# Patient Record
Sex: Female | Born: 1990
Health system: Southern US, Community
[De-identification: ages and names within clinical notes are randomized; demographics above are authoritative.]

## PROBLEM LIST (undated history)

## (undated) DIAGNOSIS — L732 Hidradenitis suppurativa: Secondary | ICD-10-CM

## (undated) DIAGNOSIS — I10 Essential (primary) hypertension: Secondary | ICD-10-CM

## (undated) DIAGNOSIS — G43909 Migraine, unspecified, not intractable, without status migrainosus: Secondary | ICD-10-CM

## (undated) HISTORY — DX: Migraine, unspecified, not intractable, without status migrainosus: G43.909

## (undated) HISTORY — PX: OTHER SURGICAL HISTORY: SHX169

## (undated) HISTORY — DX: Hidradenitis suppurativa: L73.2

---
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
## 2010-11-17 ENCOUNTER — Emergency Department (HOSPITAL_COMMUNITY): Payer: Self-pay

## 2010-11-17 ENCOUNTER — Emergency Department (HOSPITAL_COMMUNITY)
Admission: EM | Admit: 2010-11-17 | Discharge: 2010-11-17 | Disposition: A | Discharge status: Home / Self Care | Payer: Self-pay | Attending: Emergency Medicine | Admitting: Emergency Medicine

## 2010-11-17 DIAGNOSIS — Y92009 Unspecified place in unspecified non-institutional (private) residence as the place of occurrence of the external cause: Secondary | ICD-10-CM | POA: Insufficient documentation

## 2010-11-17 DIAGNOSIS — Z79899 Other long term (current) drug therapy: Secondary | ICD-10-CM | POA: Insufficient documentation

## 2010-11-17 DIAGNOSIS — W07XXXA Fall from chair, initial encounter: Secondary | ICD-10-CM | POA: Insufficient documentation

## 2010-11-17 DIAGNOSIS — K219 Gastro-esophageal reflux disease without esophagitis: Secondary | ICD-10-CM | POA: Insufficient documentation

## 2010-11-17 DIAGNOSIS — R071 Chest pain on breathing: Secondary | ICD-10-CM | POA: Insufficient documentation

## 2012-04-04 IMAGING — CR DG RIBS W/ CHEST 3+V*R*
4 series · 4 of 4 positions shown · non-contrast
Comparison: None.

CLINICAL DATA: Pain post fall

RIGHT RIBS AND CHEST - 3+ VIEW

[view not recorded (1 of 4)]
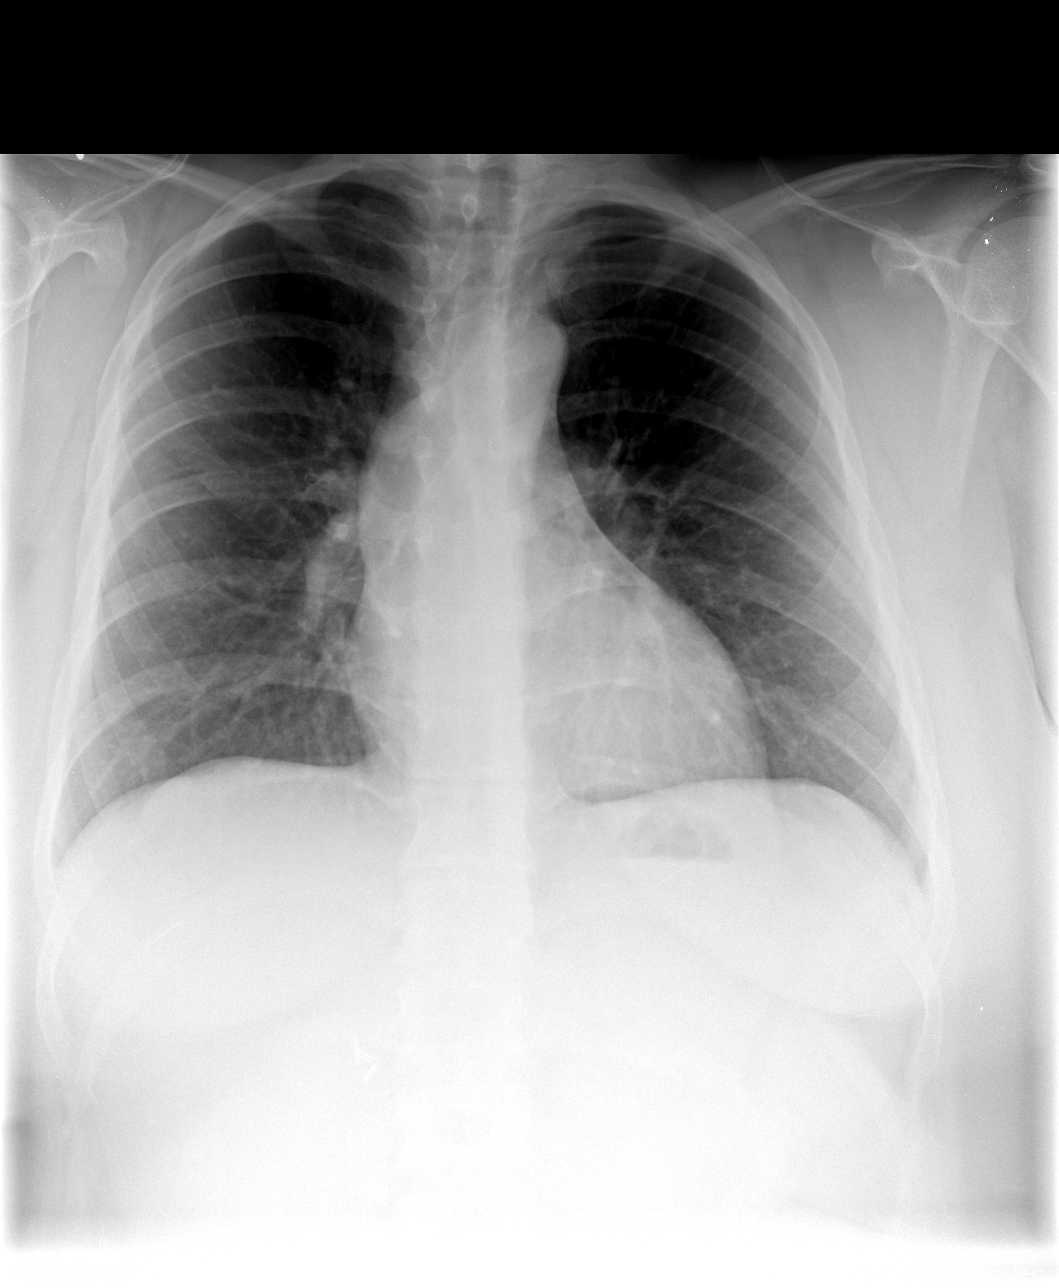

[view not recorded (2 of 4)]
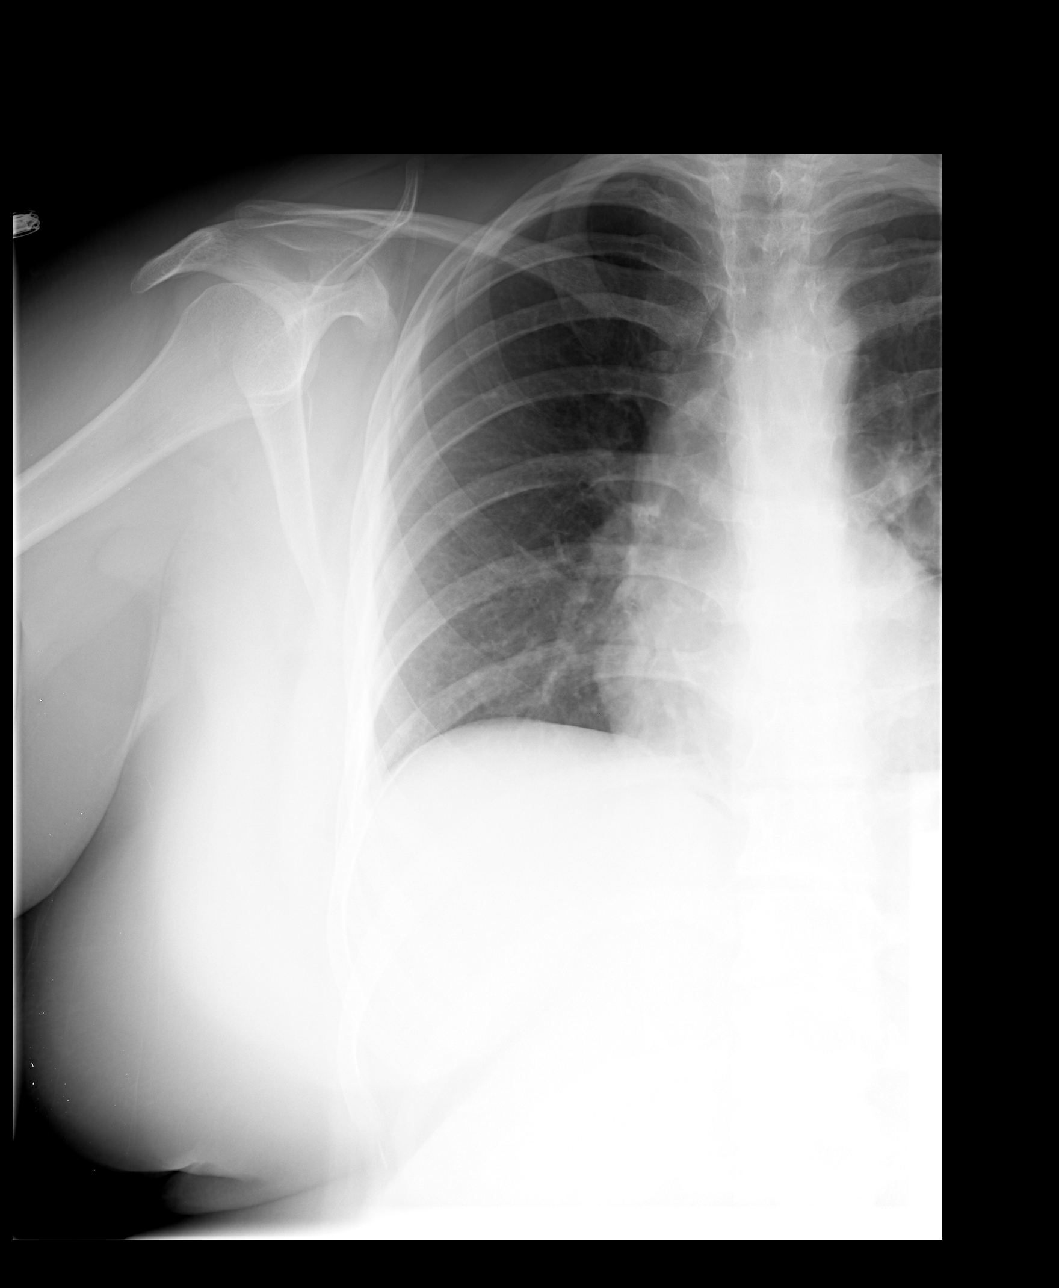

[view not recorded (3 of 4)]
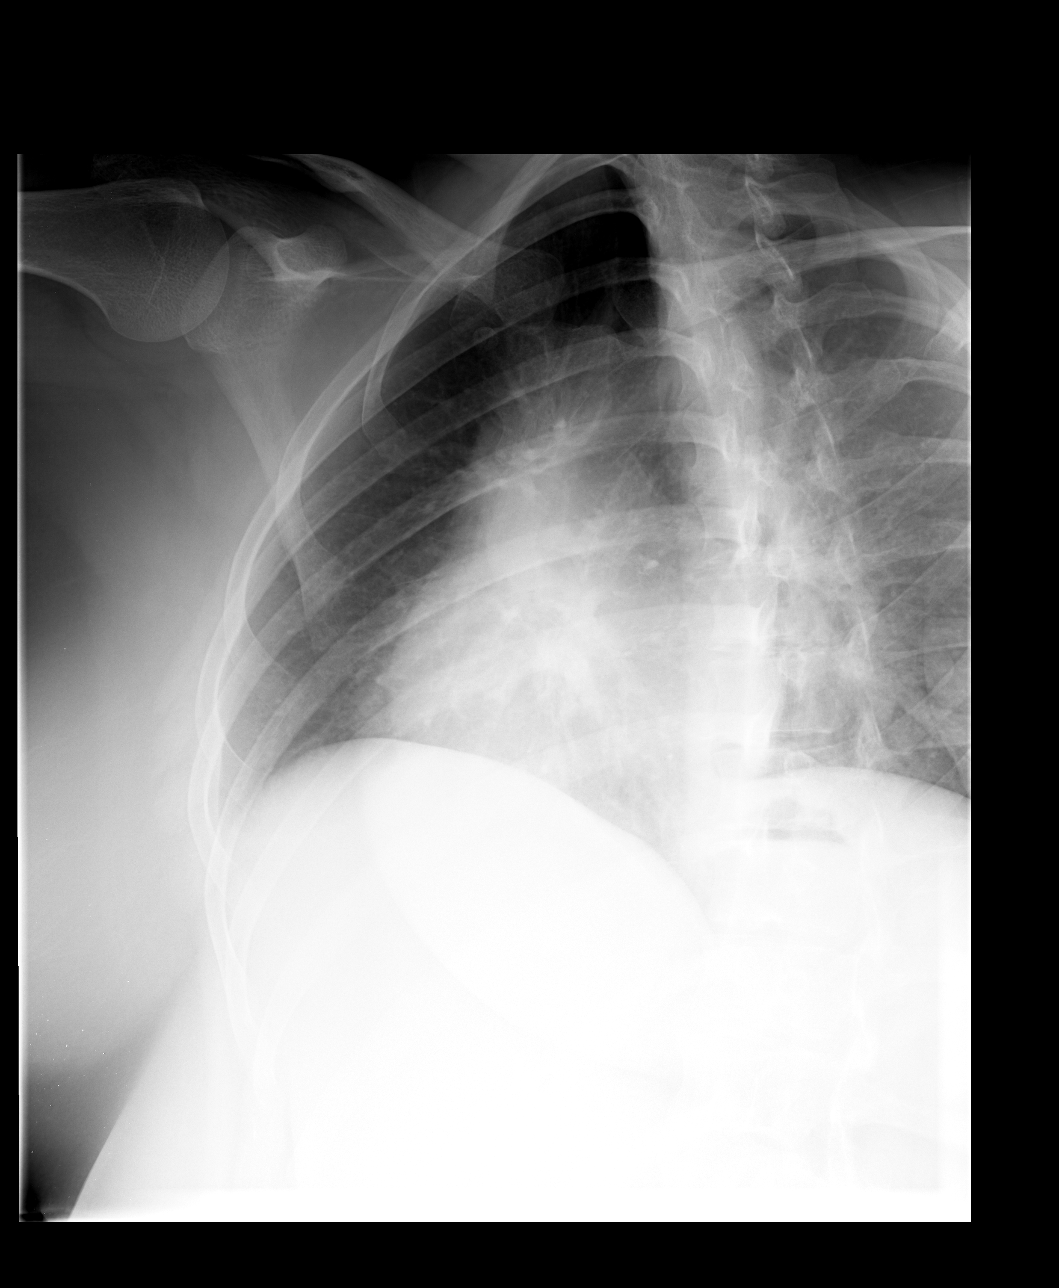

[view not recorded (4 of 4)]
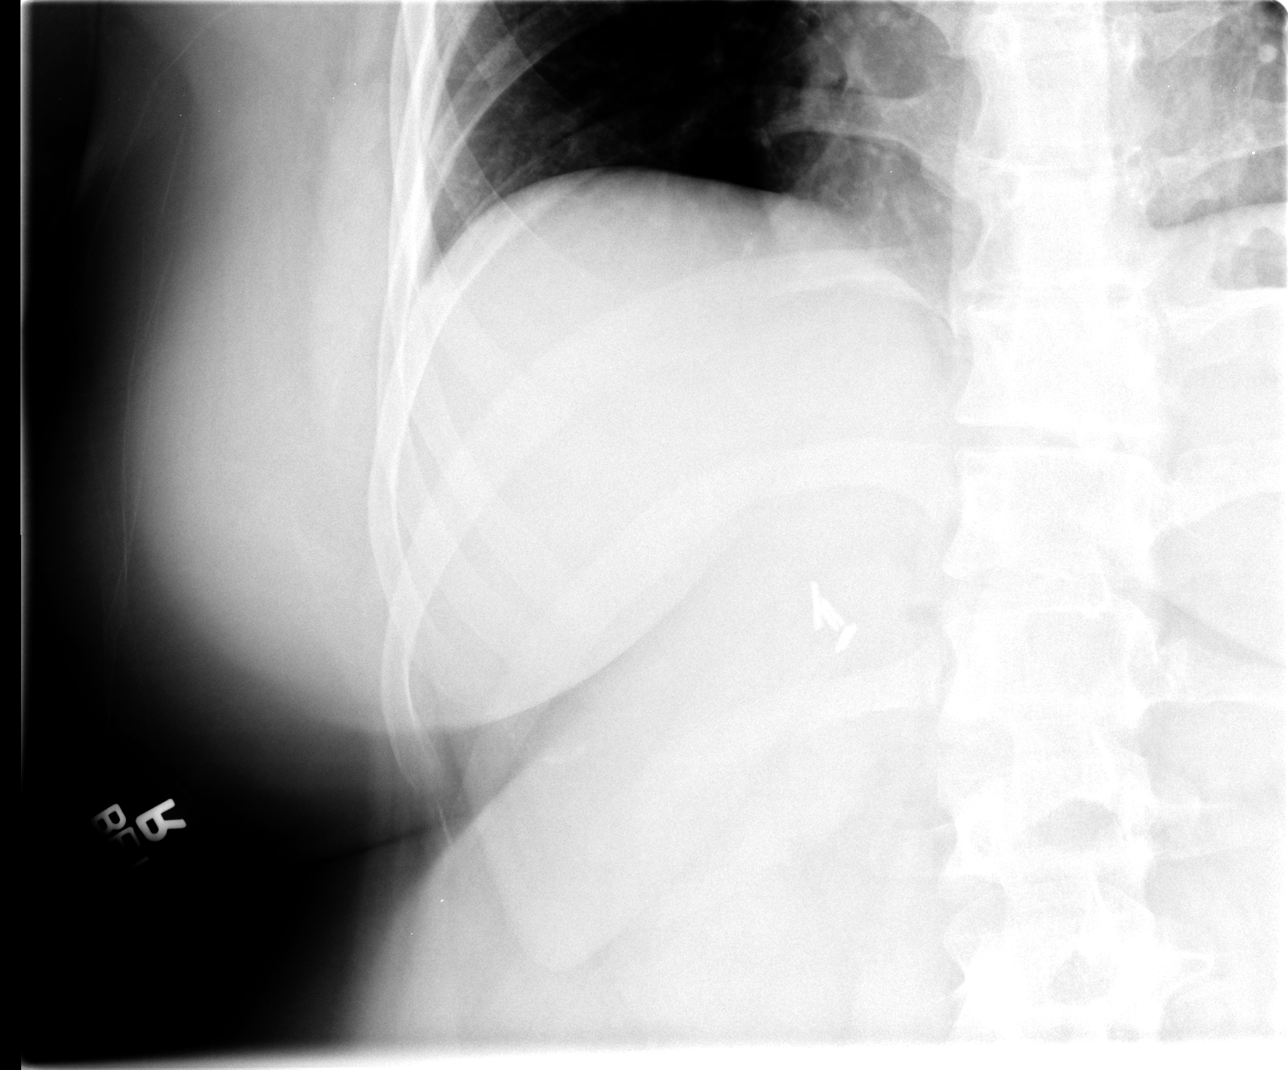

[4 of 4 positions shown; findings below may reference images not displayed]

FINDINGS: Lungs are clear.  No pneumothorax or effusion.  Heart
size normal.  Vascular clips in the right upper abdomen.  Detailed
views reveal no displaced fracture or other focal lesion.
IMPRESSION: Negative

## 2013-04-22 DIAGNOSIS — L732 Hidradenitis suppurativa: Secondary | ICD-10-CM | POA: Insufficient documentation

## 2019-10-12 DIAGNOSIS — Z7182 Exercise counseling: Secondary | ICD-10-CM | POA: Diagnosis not present

## 2019-10-12 DIAGNOSIS — E669 Obesity, unspecified: Secondary | ICD-10-CM | POA: Diagnosis not present

## 2019-10-12 DIAGNOSIS — Z713 Dietary counseling and surveillance: Secondary | ICD-10-CM | POA: Diagnosis not present

## 2019-10-12 DIAGNOSIS — Z6841 Body Mass Index (BMI) 40.0 and over, adult: Secondary | ICD-10-CM | POA: Diagnosis not present

## 2019-10-28 DIAGNOSIS — M7711 Lateral epicondylitis, right elbow: Secondary | ICD-10-CM | POA: Diagnosis not present

## 2019-11-09 DIAGNOSIS — Z20822 Contact with and (suspected) exposure to covid-19: Secondary | ICD-10-CM | POA: Diagnosis not present

## 2019-11-18 DIAGNOSIS — L732 Hidradenitis suppurativa: Secondary | ICD-10-CM | POA: Diagnosis not present

## 2019-11-18 DIAGNOSIS — Z79899 Other long term (current) drug therapy: Secondary | ICD-10-CM | POA: Diagnosis not present

## 2019-11-21 DIAGNOSIS — Z6841 Body Mass Index (BMI) 40.0 and over, adult: Secondary | ICD-10-CM | POA: Diagnosis not present

## 2019-11-21 DIAGNOSIS — E669 Obesity, unspecified: Secondary | ICD-10-CM | POA: Diagnosis not present

## 2019-11-21 DIAGNOSIS — Z79899 Other long term (current) drug therapy: Secondary | ICD-10-CM | POA: Diagnosis not present

## 2019-11-21 DIAGNOSIS — Z7182 Exercise counseling: Secondary | ICD-10-CM | POA: Diagnosis not present

## 2019-11-21 DIAGNOSIS — Z5181 Encounter for therapeutic drug level monitoring: Secondary | ICD-10-CM | POA: Diagnosis not present

## 2019-11-21 DIAGNOSIS — Z713 Dietary counseling and surveillance: Secondary | ICD-10-CM | POA: Diagnosis not present

## 2019-12-02 DIAGNOSIS — L732 Hidradenitis suppurativa: Secondary | ICD-10-CM | POA: Diagnosis not present

## 2019-12-02 DIAGNOSIS — Z79899 Other long term (current) drug therapy: Secondary | ICD-10-CM | POA: Diagnosis not present

## 2019-12-09 DIAGNOSIS — M7711 Lateral epicondylitis, right elbow: Secondary | ICD-10-CM | POA: Diagnosis not present

## 2019-12-29 DIAGNOSIS — Z7182 Exercise counseling: Secondary | ICD-10-CM | POA: Diagnosis not present

## 2019-12-29 DIAGNOSIS — Z713 Dietary counseling and surveillance: Secondary | ICD-10-CM | POA: Diagnosis not present

## 2019-12-29 DIAGNOSIS — Z6841 Body Mass Index (BMI) 40.0 and over, adult: Secondary | ICD-10-CM | POA: Diagnosis not present

## 2019-12-29 DIAGNOSIS — E669 Obesity, unspecified: Secondary | ICD-10-CM | POA: Diagnosis not present

## 2019-12-30 DIAGNOSIS — Z79899 Other long term (current) drug therapy: Secondary | ICD-10-CM | POA: Diagnosis not present

## 2019-12-30 DIAGNOSIS — L732 Hidradenitis suppurativa: Secondary | ICD-10-CM | POA: Diagnosis not present

## 2020-01-25 DIAGNOSIS — Z713 Dietary counseling and surveillance: Secondary | ICD-10-CM | POA: Diagnosis not present

## 2020-01-25 DIAGNOSIS — Z6841 Body Mass Index (BMI) 40.0 and over, adult: Secondary | ICD-10-CM | POA: Diagnosis not present

## 2020-01-25 DIAGNOSIS — Z7182 Exercise counseling: Secondary | ICD-10-CM | POA: Diagnosis not present

## 2020-02-10 DIAGNOSIS — L732 Hidradenitis suppurativa: Secondary | ICD-10-CM | POA: Diagnosis not present

## 2020-02-10 DIAGNOSIS — Z79899 Other long term (current) drug therapy: Secondary | ICD-10-CM | POA: Diagnosis not present

## 2020-02-15 DIAGNOSIS — Z7182 Exercise counseling: Secondary | ICD-10-CM | POA: Diagnosis not present

## 2020-02-15 DIAGNOSIS — Z6841 Body Mass Index (BMI) 40.0 and over, adult: Secondary | ICD-10-CM | POA: Diagnosis not present

## 2020-02-15 DIAGNOSIS — Z713 Dietary counseling and surveillance: Secondary | ICD-10-CM | POA: Diagnosis not present

## 2020-02-15 DIAGNOSIS — E669 Obesity, unspecified: Secondary | ICD-10-CM | POA: Diagnosis not present

## 2020-03-19 DIAGNOSIS — Z713 Dietary counseling and surveillance: Secondary | ICD-10-CM | POA: Diagnosis not present

## 2020-03-19 DIAGNOSIS — Z6841 Body Mass Index (BMI) 40.0 and over, adult: Secondary | ICD-10-CM | POA: Diagnosis not present

## 2020-03-19 DIAGNOSIS — Z7182 Exercise counseling: Secondary | ICD-10-CM | POA: Diagnosis not present

## 2020-03-19 DIAGNOSIS — E669 Obesity, unspecified: Secondary | ICD-10-CM | POA: Diagnosis not present

## 2020-03-19 DIAGNOSIS — Z20822 Contact with and (suspected) exposure to covid-19: Secondary | ICD-10-CM | POA: Diagnosis not present

## 2020-04-06 DIAGNOSIS — M7711 Lateral epicondylitis, right elbow: Secondary | ICD-10-CM | POA: Diagnosis not present

## 2020-04-13 DIAGNOSIS — Z79899 Other long term (current) drug therapy: Secondary | ICD-10-CM | POA: Diagnosis not present

## 2020-04-13 DIAGNOSIS — L732 Hidradenitis suppurativa: Secondary | ICD-10-CM | POA: Diagnosis not present

## 2020-04-24 DIAGNOSIS — Z713 Dietary counseling and surveillance: Secondary | ICD-10-CM | POA: Diagnosis not present

## 2020-04-24 DIAGNOSIS — Z7182 Exercise counseling: Secondary | ICD-10-CM | POA: Diagnosis not present

## 2020-04-24 DIAGNOSIS — Z6841 Body Mass Index (BMI) 40.0 and over, adult: Secondary | ICD-10-CM | POA: Diagnosis not present

## 2020-05-17 ENCOUNTER — Ambulatory Visit (INDEPENDENT_AMBULATORY_CARE_PROVIDER_SITE_OTHER): Payer: BC Managed Care – PPO | Admitting: Obstetrics & Gynecology

## 2020-05-17 ENCOUNTER — Other Ambulatory Visit: Payer: Self-pay

## 2020-05-17 ENCOUNTER — Encounter: Payer: Self-pay | Admitting: Family Medicine

## 2020-05-17 ENCOUNTER — Encounter: Payer: Self-pay | Admitting: Obstetrics & Gynecology

## 2020-05-17 ENCOUNTER — Ambulatory Visit: Payer: Self-pay

## 2020-05-17 VITALS — BP 149/95 | HR 93

## 2020-05-17 DIAGNOSIS — Z331 Pregnant state, incidental: Secondary | ICD-10-CM | POA: Diagnosis not present

## 2020-05-17 DIAGNOSIS — Z3201 Encounter for pregnancy test, result positive: Secondary | ICD-10-CM

## 2020-05-17 LAB — POCT PREGNANCY, URINE: Preg Test, Ur: POSITIVE — AB

## 2020-05-17 MED ORDER — PRENATAL GUMMIES 0.18-25 MG PO CHEW: 1.0000 | CHEWABLE_TABLET | Freq: Every day | ORAL | 2 refills | Status: DC

## 2020-05-17 NOTE — Progress Notes (Signed)
  History:  [name] is a 29 y.o. G1P0000 who presents to clinic today for pregnancy test. Denies bleeding, pelvic pain or other concerns. LMP 04/20/2019, making her [redacted]w[redacted]d.   Past Medical History:  Diagnosis Date  . Hidradenitis suppurativa   . Migraines     Past Surgical History:  Procedure Laterality Date  . CHOLECYSTECTOMY  2009   The following portions of the patient's history were reviewed and updated as appropriate: allergies, current medications, past family history, past medical history, past social history, past surgical history and problem list.   Review of Systems:  Pertinent items noted in HPI and remainder of comprehensive ROS otherwise negative.  Objective:  Physical Exam BP (!) 149/95   Pulse 93   LMP 04/19/2020 (Exact Date)  Physical Exam GENERAL: Well-developed, well-nourished female in no acute distress  SKIN: Warm, dry and without erythema PSYCH: Normal mood and affect HEENT: Normocephalic, atraumatic.   LUNGS: Normal respiratory effort, normal breath sounds HEART: Regular rate noted ABDOMEN: Soft, no masses noted PELVIC: Deferred EXTREMITIES: No edema, no cyanosis, normal range of movement  Labs and Imaging Results for orders placed or performed in visit on 05/17/20 (from the past 24 hour(s))  Pregnancy, urine POC     Status: Abnormal   Collection Time: 05/17/20  1:58 PM  Result Value Ref Range   Preg Test, Ur POSITIVE (A) NEGATIVE    Assessment & Plan:  1. Positive pregnancy test Reviewed her medication list, told her to discontinue Phentermine, Topamax and Lodine. She will follow up with her other providers to come up with alternatives. No other concerns - Prenatal MV & Min w/FA-DHA (PRENATAL GUMMIES) 0.18-25 MG CHEW; Chew 1 tablet by mouth daily.  Dispense: 30 tablet; Refill: 2  Return in about 4 weeks (around 06/14/2020) for New OB visit with any [facility] office (Except [facility]). Advised to call [facility] given proximity to [place]   I spent  15 minutes dedicated to the care of this patient including pre-visit review of records, face to face time with the patient discussing her conditions and treatments and post visit ordering of testing.   Tereso Newcomer, MD 05/17/2020 2:36 PM

## 2020-05-17 NOTE — Patient Instructions (Signed)
First Trimester of Pregnancy The first trimester of pregnancy is from week 1 until the end of week 13 (months 1 through 3). A week after a sperm fertilizes an egg, the egg will implant on the wall of the uterus. This embryo will begin to develop into a baby. Genes from you and your partner will form the baby. The female genes will determine whether the baby will be a boy or a girl. At 6-8 weeks, the eyes and face will be formed, and the heartbeat can be seen on ultrasound. At the end of 12 weeks, all the baby's organs will be formed. Now that you are pregnant, you will want to do everything you can to have a healthy baby. Two of the most important things are to get good prenatal care and to follow your health care provider's instructions. Prenatal care is all the medical care you receive before the baby's birth. This care will help prevent, find, and treat any problems during the pregnancy and childbirth. Body changes during your first trimester Your body goes through many changes during pregnancy. The changes vary from woman to woman.  You may gain or lose a couple of pounds at first.  You may feel sick to your stomach (nauseous) and you may throw up (vomit). If the vomiting is uncontrollable, call your health care provider.  You may tire easily.  You may develop headaches that can be relieved by medicines. All medicines should be approved by your health care provider.  You may urinate more often. Painful urination may mean you have a bladder infection.  You may develop heartburn as a result of your pregnancy.  You may develop constipation because certain hormones are causing the muscles that push stool through your intestines to slow down.  You may develop hemorrhoids or swollen veins (varicose veins).  Your breasts may begin to grow larger and become tender. Your nipples may stick out more, and the tissue that surrounds them (areola) may become darker.  Your gums may bleed and may be  sensitive to brushing and flossing.  Dark spots or blotches (chloasma, mask of pregnancy) may develop on your face. This will likely fade after the baby is born.  Your menstrual periods will stop.  You may have a loss of appetite.  You may develop cravings for certain kinds of food.  You may have changes in your emotions from day to day, such as being excited to be pregnant or being concerned that something may go wrong with the pregnancy and baby.  You may have more vivid and strange dreams.  You may have changes in your hair. These can include thickening of your hair, rapid growth, and changes in texture. Some women also have hair loss during or after pregnancy, or hair that feels dry or thin. Your hair will most likely return to normal after your baby is born. What to expect at prenatal visits During a routine prenatal visit:  You will be weighed to make sure you and the baby are growing normally.  Your blood pressure will be taken.  Your abdomen will be measured to track your baby's growth.  The fetal heartbeat will be listened to between weeks 10 and 14 of your pregnancy.  Test results from any previous visits will be discussed. Your health care provider may ask you:  How you are feeling.  If you are feeling the baby move.  If you have had any abnormal symptoms, such as leaking fluid, bleeding, severe headaches, or abdominal   cramping.  If you are using any tobacco products, including cigarettes, chewing tobacco, and electronic cigarettes.  If you have any questions. Other tests that may be performed during your first trimester include:  Blood tests to find your blood type and to check for the presence of any previous infections. The tests will also be used to check for low iron levels (anemia) and protein on red blood cells (Rh antibodies). Depending on your risk factors, or if you previously had diabetes during pregnancy, you may have tests to check for high blood sugar  that affects pregnant women (gestational diabetes).  Urine tests to check for infections, diabetes, or protein in the urine.  An ultrasound to confirm the proper growth and development of the baby.  Fetal screens for spinal cord problems (spina bifida) and Down syndrome.  HIV (human immunodeficiency virus) testing. Routine prenatal testing includes screening for HIV, unless you choose not to have this test.  You may need other tests to make sure you and the baby are doing well. Follow these instructions at home: Medicines  Follow your health care provider's instructions regarding medicine use. Specific medicines may be either safe or unsafe to take during pregnancy.  Take a prenatal vitamin that contains at least 600 micrograms (mcg) of folic acid.  If you develop constipation, try taking a stool softener if your health care provider approves. Eating and drinking   Eat a balanced diet that includes fresh fruits and vegetables, whole grains, good sources of protein such as meat, eggs, or tofu, and low-fat dairy. Your health care provider will help you determine the amount of weight gain that is right for you.  Avoid raw meat and uncooked cheese. These carry germs that can cause birth defects in the baby.  Eating four or five small meals rather than three large meals a day may help relieve nausea and vomiting. If you start to feel nauseous, eating a few soda crackers can be helpful. Drinking liquids between meals, instead of during meals, also seems to help ease nausea and vomiting.  Limit foods that are high in fat and processed sugars, such as fried and sweet foods.  To prevent constipation: ? Eat foods that are high in fiber, such as fresh fruits and vegetables, whole grains, and beans. ? Drink enough fluid to keep your urine clear or pale yellow. Activity  Exercise only as directed by your health care provider. Most women can continue their usual exercise routine during  pregnancy. Try to exercise for 30 minutes at least 5 days a week. Exercising will help you: ? Control your weight. ? Stay in shape. ? Be prepared for labor and delivery.  Experiencing pain or cramping in the lower abdomen or lower back is a good sign that you should stop exercising. Check with your health care provider before continuing with normal exercises.  Try to avoid standing for long periods of time. Move your legs often if you must stand in one place for a long time.  Avoid heavy lifting.  Wear low-heeled shoes and practice good posture.  You may continue to have sex unless your health care provider tells you not to. Relieving pain and discomfort  Wear a good support bra to relieve breast tenderness.  Take warm sitz baths to soothe any pain or discomfort caused by hemorrhoids. Use hemorrhoid cream if your health care provider approves.  Rest with your legs elevated if you have leg cramps or low back pain.  If you develop varicose veins in   your legs, wear support hose. Elevate your feet for 15 minutes, 3-4 times a day. Limit salt in your diet. Prenatal care  Schedule your prenatal visits by the twelfth week of pregnancy. They are usually scheduled monthly at first, then more often in the last 2 months before delivery.  Write down your questions. Take them to your prenatal visits.  Keep all your prenatal visits as told by your health care provider. This is important. Safety  Wear your seat belt at all times when driving.  Make a list of emergency phone numbers, including numbers for family, friends, the hospital, and police and fire departments. General instructions  Ask your health care provider for a referral to a local prenatal education class. Begin classes no later than the beginning of month 6 of your pregnancy.  Ask for help if you have counseling or nutritional needs during pregnancy. Your health care provider can offer advice or refer you to specialists for help  with various needs.  Do not use hot tubs, steam rooms, or saunas.  Do not douche or use tampons or scented sanitary pads.  Do not cross your legs for long periods of time.  Avoid cat litter boxes and soil used by cats. These carry germs that can cause birth defects in the baby and possibly loss of the fetus by miscarriage or stillbirth.  Avoid all smoking, herbs, alcohol, and medicines not prescribed by your health care provider. Chemicals in these products affect the formation and growth of the baby.  Do not use any products that contain nicotine or tobacco, such as cigarettes and e-cigarettes. If you need help quitting, ask your health care provider. You may receive counseling support and other resources to help you quit.  Schedule a dentist appointment. At home, brush your teeth with a soft toothbrush and be gentle when you floss. Contact a health care provider if:  You have dizziness.  You have mild pelvic cramps, pelvic pressure, or nagging pain in the abdominal area.  You have persistent nausea, vomiting, or diarrhea.  You have a bad smelling vaginal discharge.  You have pain when you urinate.  You notice increased swelling in your face, hands, legs, or ankles.  You are exposed to fifth disease or chickenpox.  You are exposed to German measles (rubella) and have never had it. Get help right away if:  You have a fever.  You are leaking fluid from your vagina.  You have spotting or bleeding from your vagina.  You have severe abdominal cramping or pain.  You have rapid weight gain or loss.  You vomit blood or material that looks like coffee grounds.  You develop a severe headache.  You have shortness of breath.  You have any kind of trauma, such as from a fall or a car accident. Summary  The first trimester of pregnancy is from week 1 until the end of week 13 (months 1 through 3).  Your body goes through many changes during pregnancy. The changes vary from  woman to woman.  You will have routine prenatal visits. During those visits, your health care provider will examine you, discuss any test results you may have, and talk with you about how you are feeling. This information is not intended to replace advice given to you by your health care provider. Make sure you discuss any questions you have with your health care provider. Document Revised: 06/05/2017 Document Reviewed: 06/04/2016 Elsevier Patient Education  2020 [employer]  

## 2020-05-23 DIAGNOSIS — Z3201 Encounter for pregnancy test, result positive: Secondary | ICD-10-CM | POA: Diagnosis not present

## 2020-05-25 DIAGNOSIS — Z79899 Other long term (current) drug therapy: Secondary | ICD-10-CM | POA: Diagnosis not present

## 2020-05-25 DIAGNOSIS — L732 Hidradenitis suppurativa: Secondary | ICD-10-CM | POA: Diagnosis not present

## 2020-05-29 DIAGNOSIS — Z3201 Encounter for pregnancy test, result positive: Secondary | ICD-10-CM | POA: Diagnosis not present

## 2020-06-05 DIAGNOSIS — Z331 Pregnant state, incidental: Secondary | ICD-10-CM | POA: Diagnosis not present

## 2020-06-12 ENCOUNTER — Other Ambulatory Visit: Payer: BC Managed Care – PPO

## 2020-06-12 DIAGNOSIS — Z331 Pregnant state, incidental: Secondary | ICD-10-CM | POA: Diagnosis not present

## 2020-06-14 ENCOUNTER — Other Ambulatory Visit: Payer: Self-pay | Admitting: Obstetrics & Gynecology

## 2020-06-14 DIAGNOSIS — O3680X0 Pregnancy with inconclusive fetal viability, not applicable or unspecified: Secondary | ICD-10-CM

## 2020-06-15 ENCOUNTER — Other Ambulatory Visit: Payer: Self-pay

## 2020-06-15 ENCOUNTER — Ambulatory Visit (INDEPENDENT_AMBULATORY_CARE_PROVIDER_SITE_OTHER): Payer: BC Managed Care – PPO

## 2020-06-15 DIAGNOSIS — Z3A08 8 weeks gestation of pregnancy: Secondary | ICD-10-CM

## 2020-06-15 DIAGNOSIS — O3680X0 Pregnancy with inconclusive fetal viability, not applicable or unspecified: Secondary | ICD-10-CM | POA: Diagnosis not present

## 2020-06-15 NOTE — Progress Notes (Signed)
[place] 8+1 wks,single IUP with YS,positive fht 178 bpm,crl 17.61 mm,normal ovaries

## 2020-06-22 DIAGNOSIS — M5442 Lumbago with sciatica, left side: Secondary | ICD-10-CM | POA: Diagnosis not present

## 2020-06-22 DIAGNOSIS — M9903 Segmental and somatic dysfunction of lumbar region: Secondary | ICD-10-CM | POA: Diagnosis not present

## 2020-06-22 DIAGNOSIS — M9902 Segmental and somatic dysfunction of thoracic region: Secondary | ICD-10-CM | POA: Diagnosis not present

## 2020-06-22 DIAGNOSIS — M9905 Segmental and somatic dysfunction of pelvic region: Secondary | ICD-10-CM | POA: Diagnosis not present

## 2020-07-04 DIAGNOSIS — M7711 Lateral epicondylitis, right elbow: Secondary | ICD-10-CM | POA: Diagnosis not present

## 2020-07-07 NOTE — L&D Delivery Note (Addendum)
Vacuum Assisted Vaginal Delivery  Indication for operative vaginal delivery: NRFHT  Patient was examined and found to be fully dilated with fetal station of +2.  Patient's bladder was noted to be empty, and there were no known fetal contraindications to operative vaginal delivery. FHR tracing remarkable for recurrent deep late/variable decels with contractions. Dr. Alysia Penna called to bedside for assistance.  Risks of vacuum assistance were discussed in detail, including but not limited to, bleeding, infection, damage to maternal tissues, fetal cephalohematoma, inability to effect vaginal delivery of the head or shoulder dystocia that cannot be resolved by established maneuvers and need for emergency cesarean section.  Patient gave verbal consent.  The soft vacuum cup was positioned over the sagittal suture 3 cm anterior to posterior fontanelle.  Pressure was then increased to 500 mmHg, and the patient was instructed to push.  Pulling was administered along the pelvic curve while patient was pushing; there were 4 contractions and 0 popoffs.  Vacuum was reduced in between contractions.  The infant was then delivered atraumatically, noted to be a viable female infant, Apgars of 8 and 9.  No nuchal cord noted at delivery. Arterial blood gas sent. Neonatology present for delivery.  There was spontaneous placental delivery, intact with three-vessel cord.  3b degree perineal laceration noted requiring repair, given poor visualization and pain control will repair in OR, please refer to operative note for further details. [id number] mL, epidural anesthesia.   Sponge, instrument and needle counts were correct x 2.  The patient and baby were stable after delivery and remained in couplet care, with plans to transfer later to postpartum unit.  Alric Seton, MD OB Fellow, Faculty [facility], Center for [facility] 01/18/2021 10:38 PM  OB Attending I was present in the room for the entire  delivery. Agree with above. See OP note for additional information on vaginal repair.  Nettie Elm, MD

## 2020-07-13 ENCOUNTER — Other Ambulatory Visit: Payer: Self-pay | Admitting: Obstetrics & Gynecology

## 2020-07-13 DIAGNOSIS — Z79899 Other long term (current) drug therapy: Secondary | ICD-10-CM | POA: Diagnosis not present

## 2020-07-13 DIAGNOSIS — Z3682 Encounter for antenatal screening for nuchal translucency: Secondary | ICD-10-CM

## 2020-07-13 DIAGNOSIS — L732 Hidradenitis suppurativa: Secondary | ICD-10-CM | POA: Diagnosis not present

## 2020-07-13 DIAGNOSIS — Z5112 Encounter for antineoplastic immunotherapy: Secondary | ICD-10-CM | POA: Diagnosis not present

## 2020-07-16 ENCOUNTER — Other Ambulatory Visit: Payer: BC Managed Care – PPO

## 2020-07-16 ENCOUNTER — Encounter: Payer: BC Managed Care – PPO | Admitting: Women's Health

## 2020-07-16 ENCOUNTER — Ambulatory Visit: Payer: BC Managed Care – PPO | Admitting: *Deleted

## 2020-07-16 DIAGNOSIS — Z20822 Contact with and (suspected) exposure to covid-19: Secondary | ICD-10-CM | POA: Diagnosis not present

## 2020-07-16 DIAGNOSIS — Z20828 Contact with and (suspected) exposure to other viral communicable diseases: Secondary | ICD-10-CM | POA: Diagnosis not present

## 2020-07-19 ENCOUNTER — Encounter: Payer: Self-pay | Admitting: *Deleted

## 2020-07-19 DIAGNOSIS — U071 COVID-19: Secondary | ICD-10-CM | POA: Diagnosis not present

## 2020-07-19 DIAGNOSIS — R509 Fever, unspecified: Secondary | ICD-10-CM | POA: Diagnosis not present

## 2020-07-19 DIAGNOSIS — R059 Cough, unspecified: Secondary | ICD-10-CM | POA: Diagnosis not present

## 2020-07-19 DIAGNOSIS — Z20822 Contact with and (suspected) exposure to covid-19: Secondary | ICD-10-CM | POA: Diagnosis not present

## 2020-07-27 ENCOUNTER — Ambulatory Visit: Payer: BC Managed Care – PPO | Admitting: *Deleted

## 2020-07-27 ENCOUNTER — Encounter: Payer: BC Managed Care – PPO | Admitting: Women's Health

## 2020-08-02 ENCOUNTER — Encounter: Payer: Self-pay | Admitting: Medical

## 2020-08-02 DIAGNOSIS — Z3400 Encounter for supervision of normal first pregnancy, unspecified trimester: Secondary | ICD-10-CM | POA: Insufficient documentation

## 2020-08-02 DIAGNOSIS — O0990 Supervision of high risk pregnancy, unspecified, unspecified trimester: Secondary | ICD-10-CM | POA: Insufficient documentation

## 2020-08-03 ENCOUNTER — Encounter: Payer: Self-pay | Admitting: Medical

## 2020-08-03 ENCOUNTER — Ambulatory Visit (INDEPENDENT_AMBULATORY_CARE_PROVIDER_SITE_OTHER): Payer: BC Managed Care – PPO | Admitting: Medical

## 2020-08-03 ENCOUNTER — Other Ambulatory Visit: Payer: Self-pay

## 2020-08-03 ENCOUNTER — Ambulatory Visit: Payer: BC Managed Care – PPO | Admitting: *Deleted

## 2020-08-03 VITALS — BP 133/93 | HR 101 | Ht 62.0 in | Wt 290.0 lb

## 2020-08-03 DIAGNOSIS — Z3482 Encounter for supervision of other normal pregnancy, second trimester: Secondary | ICD-10-CM | POA: Diagnosis not present

## 2020-08-03 DIAGNOSIS — Z3A15 15 weeks gestation of pregnancy: Secondary | ICD-10-CM

## 2020-08-03 DIAGNOSIS — O99210 Obesity complicating pregnancy, unspecified trimester: Secondary | ICD-10-CM

## 2020-08-03 DIAGNOSIS — G43909 Migraine, unspecified, not intractable, without status migrainosus: Secondary | ICD-10-CM | POA: Insufficient documentation

## 2020-08-03 DIAGNOSIS — M7711 Lateral epicondylitis, right elbow: Secondary | ICD-10-CM

## 2020-08-03 DIAGNOSIS — Z3402 Encounter for supervision of normal first pregnancy, second trimester: Secondary | ICD-10-CM

## 2020-08-03 DIAGNOSIS — M7710 Lateral epicondylitis, unspecified elbow: Secondary | ICD-10-CM | POA: Insufficient documentation

## 2020-08-03 DIAGNOSIS — G43809 Other migraine, not intractable, without status migrainosus: Secondary | ICD-10-CM

## 2020-08-03 DIAGNOSIS — Z31430 Encounter of female for testing for genetic disease carrier status for procreative management: Secondary | ICD-10-CM | POA: Diagnosis not present

## 2020-08-03 DIAGNOSIS — L732 Hidradenitis suppurativa: Secondary | ICD-10-CM

## 2020-08-03 LAB — POCT URINALYSIS DIPSTICK OB
Blood, UA: NEGATIVE
Glucose, UA: NEGATIVE
Ketones, UA: NEGATIVE
Leukocytes, UA: NEGATIVE
Nitrite, UA: NEGATIVE
POC,PROTEIN,UA: NEGATIVE

## 2020-08-03 NOTE — Patient Instructions (Addendum)
Safe Medications in Pregnancy   Acne:  Benzoyl Peroxide  Salicylic Acid   Backache/Headache:  Tylenol: 2 regular strength every 4 hours OR        2 Extra strength every 6 hours   Colds/Coughs/Allergies:  Benadryl (alcohol free) 25 mg every 6 hours as needed  Breath right strips  Claritin  Cepacol throat lozenges  Chloraseptic throat spray  Cold-Eeze- up to three times per day  Cough drops, alcohol free  Flonase (by prescription only)  Guaifenesin  Mucinex  Robitussin DM (plain only, alcohol free)  Saline nasal spray/drops  Sudafed (pseudoephedrine) & Actifed * use only after [redacted] weeks gestation and if you do not have high blood pressure  Tylenol  Vicks Vaporub  Zinc lozenges  Zyrtec   Constipation:  Colace  Ducolax suppositories  Fleet enema  Glycerin suppositories  Metamucil  Milk of magnesia  Miralax  Senokot  Smooth move tea   Diarrhea:  Kaopectate  Imodium A-D   *NO pepto Bismol   Hemorrhoids:  Anusol  Anusol HC  Preparation H  Tucks   Indigestion:  Tums  Maalox  Mylanta  Zantac  Pepcid   Insomnia:  Benadryl (alcohol free) 25mg every 6 hours as needed  Tylenol PM  Unisom, no Gelcaps   Leg Cramps:  Tums  MagGel   Nausea/Vomiting:  Bonine  Dramamine  Emetrol  Ginger extract  Sea bands  Meclizine  Nausea medication to take during pregnancy:  Unisom (doxylamine succinate 25 mg tablets) Take one tablet daily at bedtime. If symptoms are not adequately controlled, the dose can be increased to a maximum recommended dose of two tablets daily (1/2 tablet in the morning, 1/2 tablet mid-afternoon and one at bedtime).  Vitamin B6 100mg tablets. Take one tablet twice a day (up to 200 mg per day).   Skin Rashes:  Aveeno products  Benadryl cream or 25mg every 6 hours as needed  Calamine Lotion  1% cortisone cream   Yeast infection:  Gyne-lotrimin 7  Monistat 7    **If taking multiple medications, please check labels to avoid  duplicating the same active ingredients  **take medication as directed on the label  ** Do not exceed 4000 mg of tylenol in 24 hours  **Do not take medications that contain aspirin or ibuprofen         Childbirth Education Options: [facility] Classes:  Childbirth education classes can help you get ready for a positive parenting experience. You can also meet other expectant parents and get free stuff for your baby. Each class runs for five weeks on the same night and costs $45 for the mother-to-be and her support person. Medicaid covers the cost if you are eligible. Call [phone] to register. [facility] Childbirth Education:  [phone] or [phone] or [email or web address]  Baby & Me Class: Discuss newborn & infant parenting and family adjustment issues with other new mothers in a relaxed environment. Each week brings a new speaker or baby-centered activity. We encourage new mothers to join us every Thursday at 11:00am. Babies birth until crawling. No registration or fee. Daddy Boot Camp: This course offers Dads-to-be the tools and knowledge needed to feel confident on their journey to becoming new fathers. Experienced dads, who have been trained as coaches, teach dads-to-be how to hold, comfort, diaper, swaddle and play with their infant while being able to support the new mom as well. A class for men taught by men. $25/dad Big Brother/Big Sister: Let your children share in the joy   mothers as they journey through the adjustments and struggles of that sometimes overwhelming first year after the birth of a child. Tuesdays at 10:00am and  Thursdays at 6:00pm. Babies welcome. No registration or fee. Breastfeeding Support Group: This group is a mother-to-mother support circle where moms have the opportunity to share their breastfeeding experiences. A Lactation Consultant is present for questions and concerns. Meets each Tuesday at 11:00am. No fee or registration. Breastfeeding Your Baby: Learn what to expect in the first days of breastfeeding your newborn.  This class will help you feel more confident with the skills needed to begin your breastfeeding experience. Many new mothers are concerned about breastfeeding after leaving the hospital. This class will also address the most common fears and challenges about breastfeeding during the first few weeks, months and beyond. (call for fee) Comfort Techniques and Tour: This 2 hour interactive class will provide you the opportunity to learn & practice hands-on techniques that can help relieve some of the discomfort of labor and encourage your baby to rotate toward the best position for birth. You and your partner will be able to try a variety of labor positions with birth balls and rebozos as well as practice breathing, relaxation, and visualization techniques. A tour of the [facility] is included with this class. $20 per registrant and support person Childbirth Class- Weekend Option: This class is a Weekend version of our Birth & Baby series. It is designed for parents who have a difficult time fitting several weeks of classes into their schedule. It covers the care of your newborn and the basics of labor and childbirth. It also includes a Maternity Care Center Tour of [facility] and lunch. The class is held two consecutive days: beginning on Friday evening from 6:30 - 8:30 p.m. and the next day, Saturday from 9 a.m. - 4 p.m. (call for fee) Waterbirth Class: Interested in a waterbirth?  This informational class will help you discover whether waterbirth is the right fit  for you. Education about waterbirth itself, supplies you would need and how to assemble your support team is what you can expect from this class. Some obstetrical practices require this class in order to pursue a waterbirth. (Not all obstetrical practices offer waterbirth-check with your healthcare provider.) Register only the expectant mom, but you are encouraged to bring your partner to class! Required if planning waterbirth, no fee. Infant/Child CPR: Parents, grandparents, babysitters, and friends learn Cardio-Pulmonary Resuscitation skills for infants and children. You will also learn how to treat both conscious and unconscious choking in infants and children. This Family & Friends program does not offer certification. Register each participant individually to ensure that enough mannequins are available. (Call for fee) Grandparent Love: Expecting a grandbaby? This class is for you! Learn about the latest infant care and safety recommendations and ways to support your own child as he or she transitions into the parenting role. Taught by Registered Nurses who are childbirth instructors, but most importantly...they are grandmothers too! $10/person. Childbirth Class- Natural Childbirth: This series of 5 weekly classes is for expectant parents who want to learn and practice natural methods of coping with the process of labor and childbirth. Relaxation, breathing, massage, visualization, role of the partner, and helpful positioning are highlighted. Participants learn how to be confident in their body's ability to give birth. This class will empower and help parents make informed decisions about their own care. Includes discussion that will help new parents transition into the immediate postpartum period. Maternity   who want to learn and practice natural methods of coping with the process of labor and childbirth. Relaxation, breathing, massage, visualization, role of the partner, and helpful positioning are highlighted. Participants learn how to be confident in their body's ability to give birth. This class will empower and help parents make informed decisions about their own care. Includes discussion that will help new parents transition into the immediate postpartum period. Maternity Care Center Tour of [facility] is included. We suggest taking this class between 25-32 weeks, but  it's only a recommendation. $75 per registrant and one support person or $30 Medicaid. Childbirth Class- 3 week Series: This option of 3 weekly classes helps you and your labor partner prepare for childbirth. Newborn care, labor & birth, cesarean birth, pain management, and comfort techniques are discussed and a Maternity Care Center Tour of [facility] is included. The class meets at the same time, on the same day of the week for 3 consecutive weeks beginning with the starting date you choose. $60 for registrant and one support person.  Marvelous Multiples: Expecting twins, triplets, or more? This class covers the differences in labor, birth, parenting, and breastfeeding issues that face multiples' parents. NICU tour is included. Led by a Certified Childbirth Educator who is the mother of twins. No fee. Caring for Baby: This class is for expectant and adoptive parents who want to learn and practice the most up-to-date newborn care for their babies. Focus is on birth through the first six weeks of life. Topics include feeding, bathing, diapering, crying, umbilical cord care, circumcision care and safe sleep. Parents learn to recognize symptoms of illness and when to call the pediatrician. Register only the mom-to-be and your partner or support person can plan to come with you! $10 per registrant and support person Childbirth Class- online option: This online class offers you the freedom to complete a Birth and Baby series in the comfort of your own home. The flexibility of this option allows you to review sections at your own pace, at times convenient to you and your support people. It includes additional video information, animations, quizzes, and extended activities. Get organized with helpful eClass tools, checklists, and trackers. Once you register online for the class, you will receive an email within a few days to accept the invitation and begin the class when the time is right for you. The content  will be available to you for 60 days. $60 for 60 days of online access for you and your support people.        AREA PEDIATRIC/FAMILY PRACTICE PHYSICIANS  [place] ([address]) . [facility] o Chambliss, MD; Eniola, MD; Hale, MD; Hensel, MD; McDiarmid, MD; McIntyer, MD; Neal, MD; Walden, MD o [address] [place], [place] [address] o [phone] o Mon-Fri 8:30-12:30, 1:30-5:00 o Providers come to see babies at [facility] o Accepting Medicaid . [facility] o Limited providers who accept newborns: Koirala, MD; Morrow, MD; Wolters, MD o [address], [place], [place] [address] o [phone] o Mon-Fri 8:00-5:30 o Babies seen by providers at [facility] o Does NOT accept Medicaid o Please call early in hospitalization for appointment (limited availability)  . [facility] o Mulberry, MD o [address] [place], [place] [address] o [phone] o Mon, Tue, Thur, Fri 8:30-5:00, Wed 10:00-7:00 (closed 1-2pm) o Babies seen by [facility] providers o Accepting Medicaid . [facility] - Pediatrician o Rubin, MD o [address], [place], [place] [address] o [phone] o Mon-Fri 8:30-5:00, Sat 8:30-12:00 o Provider comes to see babies at [facility] o Accepting Medicaid o Must have been referred from current patients or contacted office prior to delivery . [facility] ([facility]) o Brown, MD; Chandler, MD; Ettefagh, MD; Grant, MD; Lester, MD; McCormick, MD; McQueen, MD; Prose, MD; Simha, MD; Stanley, MD; Stryffeler, NP; Tebben, NP o [address], [place], [place] [address] o [phone] o Mon, Tue, Thur, Fri 8:30-5:30, Wed 9:30-5:30, Sat 8:30-12:30 o Babies seen by [facility] providers o Accepting Medicaid o Only accepting infants of first-time parents or siblings of  current patients o Hospital discharge coordinator will make follow-up appointment . Jack Amos o [address], [place], [place]  [address] o [phone]   Fax - [phone] . [facility] o [address], [place], [place]  [address] o Phone - [phone]   Fax - [phone] . Shilpa Gosrani o [address], [place], [place]  [address] o [phone]  [place] ([address]) . [facility] o Bates, MD; [place], MD; Cooper, Cox, MD; MD; Davis, MD; Dovico, MD; Ettefaugh, MD; Little, MD; Lowe, MD; Keiffer, MD; Melvin, MD; Sumner, MD; Williams, MD o [address], [place], [place] [address] o [phone] o Mon-Fri 8:30-5:00 (extended evenings Mon-Thur as needed), Sat-Sun 10:00-1:00 o Providers come to see babies at [facility] o Accepting Medicaid for families of first-time babies and families with all children in the household age 3 and under. Must register with office prior to making appointment (M-F only). . [facility] o Henson, NP; Knapp, MD; Lalonde, MD; Tysinger, PA o [address] [place], [place] [address] o [phone] o Mon-Fri 8:00-5:00 o Babies seen by providers at [facility] o Does NOT accept Medicaid/Commercial Insurance Only . [facility] ([facility])  o Artis, MD; Barnes, MD; Bratton, MD; Coccaro, MD; Lockett Gardner, MD; Kramer, MD; Marshall, MD; Netherton, MD; Poleto, MD; Skinner, MD o [address] [place], [place] [address] o [phone] o Mon-Fri 8:30-5:30, Sat (Oct.-Mar.) 9:00-1:00 o Babies seen by providers at [facility] o Accepting Medicaid  [place] ([address]) . [facility] o Reid, MD; Warner, MD o [address], [place], [place] [address] o [phone] o Mon-Fri 8:30-5:00, Sat 8:30-12:00 o Providers come to see babies at [facility] o Does NOT accept Medicaid . [facility] o Becker, PA; Hagler, MD; Scifres, PA; Sun, MD; Swayne, MD o [address], [place], [place] [address] o [phone] o Mon-Fri 8:00-5:00 o Babies seen by providers at [facility] o Does NOT accept Medicaid o Only accepting babies of parents who are patients o Please call early in hospitalization for appointment (limited availability) . [facility] o Clark, MD; Frye, MD; Kelleher, MD; Mack, NP; Miller, MD; O'Keller, MD; Patterson, NP; Pudlo, MD; Puzio, MD; Thomas, MD; Tucker, MD; Twiselton, MD o [address], [place], [place] [address] o [phone] o Mon-Fri 8:00-5:00, Sat 9:00-12:00 o Providers come to see babies at [facility] o Does NOT accept Medicaid  [place]  ([address]) . [facility] o Limited providers accepting new patients: Brake, NP; Wharton, PA o [address], , [place] [address] o [phone] o Mon-Fri 8:00-5:00 o Babies seen by providers at [facility] o Does NOT accept Medicaid   o Only accepting babies of parents who are patients o Please call early in hospitalization for appointment (limited availability) . [facility] o Gay, MD; Quinlan, MD o [address] [place], [place] [address] o [phone] (press 1 to schedule appointment) o Mon-Fri 8:00-5:00 o Providers come to see babies at [facility] o Does NOT accept Medicaid . [facility] o Mazer, MD o [address]  [place], [place] [address] o [phone] o Mon-Fri 8:30-5:00 (lunch 12:30-1:00), extended hours by appointment only Wed 5:00-6:30 o Babies seen by [facility] providers o Accepting Medicaid . [facility] o Banks, MD; Jordan, MD; Koberlein, MD o [address], [place], [place] [address] o [phone] o Mon-Fri 8:00-5:00 o Babies seen by [facility] providers o Does NOT accept Medicaid . [facility] o Parker, MD; Hunter, MD; Wallace, DO o [address] [place], [place] [address] o [phone] o Mon-Fri 8:00-5:00 o Babies seen by [facility] providers o Does NOT accept Medicaid . [facility] o Brandon, PA; Brecken, PA; Christy, NP; Dees, MD; DeClaire, MD; DeWeese, MD; Hansen, NP; Mills, NP; Parrish, NP; Smoot, NP; Summer, MD; Vapne, MD o [address] [place], [place] [address] o [phone] o Mon-Fri 8:30-5:00, Sat 10:00-1:00 o Providers come to see babies at [facility] o Does NOT accept Medicaid o Free prenatal information session Tuesdays at 4:45pm . [facility] o Bouska, MD; Gordon, PA; Jeffery, PA; Weber, PA o [address] [place] [address] o [phone] o Mon-Fri 7:30-5:30 o Babies seen by [facility] providers . [facility] o [address], [place], [place]  [address] o [phone]   Fax - [phone]  [place] ([address] & [address]) . [facility] o [place], [place] o [address] [place], [place] [address] o [phone] o Mon-Thur 8:00-6:00 o Providers come to see babies at [facility] o Accepting Medicaid . [facility] o Anderson, NP; Badger, MD; Beal, PA; [place], PA o [address] [place], [place] [address] o [phone] o Mon-Thur 7:30-7:30, Fri 7:30-4:30 o Babies seen by [facility] providers o Accepting Medicaid . [facility] o Agbuya, MD; Klett, NP; Romgoolam, MD o [address], [place], [place] [address] o [phone] o Mon-Fri 8:30-5:00, Sat 8:30-12:00 o Providers come to see babies at [facility] o Accepting Medicaid o Must have "Meet & Greet" appointment at office prior to delivery . [facility] ([facility] o McCord, MD; Wallace, MD; Wood, MD o [address], [place], [place]  [address] o [phone] o Mon-Wed 8:00-6:00, Thur-Fri 8:00-5:00, Sat 9:00-12:00 o Providers come to see babies at [facility] o Does NOT accept Medicaid o Only accepting siblings of current patients . [facility]  o [address], [place], [place]  [address] o [phone]   Fax - [phone] . [facility] o [address], [place], [place]  [address] o [phone]   Fax - [phone]  [place] ([address] & [address]) . [facility] o Cirigliano, DO; Matthews, DO o [address] , [place] [address] o [phone] o Mon-Fri 7:00-5:00 o Babies seen by [facility] providers o Does NOT accept Medicaid . [facility] o Briscoe, MD; Howley, PA; Moreira, PA o [address], [place], [place] [address] o [phone] o Mon-Fri 8:00-5:00 o   Babies seen by [facility] providers o Accepting Medicaid . [facility] o Boyd, MD; Church, PA; Jones, NP; Osborn, PA o [address], [place], [place] [address] o [phone] o Mon-Fri 8:00-5:00 o Babies seen by providers at [facility] o Accepting Medicaid  [place] ([address]) . [facility] [place] o Wendling, DO o [address] [place], [place] [address] o [phone] o Mon-Fri 8:00-5:00 o Babies seen by [facility] providers o Does NOT accept Medicaid o Limited availability, please call early in hospitalization to schedule follow-up . [facility] o Calderon, PA; Cummings, MD; Dillard, MD; Martin, PA; Olson, MD; VanDeven, PA o [address], [place], [place] [address] o [phone] o Mon-Fri 8:30-5:00, Sat 9:00-12:00 o Babies seen by providers at [facility] o Accepting Medicaid o Please register online then schedule online or call  office o [email or web address] . [facility] ([facility] o Hunter, NP; Kumar, MD; Martin Rogers, PA o [address], [place], [place] [address] o [phone] o Mon-Fri 8:00-5:00 o Babies seen by providers at [facility] o Accepting Medicaid . [facility] ([facility] o [place], MD; Kristi Fleenor, NP; West, MD o [address], [place], [place] [address] o [phone] o Mon-Fri 8:00-5:30, Sat&Sun by appointment (phones open at 8:30) o Babies seen by [facility] providers o Accepting Medicaid o Must be a first-time baby or sibling of current patient . [facility] [place]  o [address], [place], [place]  [address] o [phone]   Fax - [phone]  [place] ([address]) . [facility] o Brown, PA; Cowen, PA; Rice, MD; Helton, PA; Spry, MD o [address] [place], [place] [address] o [phone] o Mon-Thur 8:00-7:00, Fri 8:00-5:00, Sat 8:00-12:00, Sun 9:00-12:00 o Babies seen by [facility] providers o Accepting Medicaid . [facility] o Coe-Goins, MD; Marshall, MD; Pierre-Louis, MD o [address] [place] [address], [place], [place] [address] o [phone] o Mon-Thur 8:00-5:00 o Babies seen by providers at [facility] o Accepting Medicaid . [facility] o Bratton, MD; Coe-Goins, MD; Hayes, MD; Lewis, MD; List, MD; Lott, MD; Marshall, MD; Moran, MD; O'Neal, MD; Pierre-Louis, MD; Pitonzo, MD; Scholer, MD; Spangle, MD o [address] [place], [place] [address] o [phone] o Mon-Fri 8:00-5:30, Sat (Oct.-Mar.) 9:00-1:00 o Babies seen by providers at [facility] o Accepting Medicaid o Must fill out new patient packet, available online at [email or web address] . [facility] ([facility] o Friddle, NP; Harris, NP; Kelly, NP; Logan, MD; Melvin, PA; Poth, MD; Ramadoss, MD; Stanton, NP o [address], [place], [place] [address] o [phone] o Mon-Thur 8:00-5:30, Fri 8:00-5:00 o Babies seen by providers at [facility] o Accepting Medicaid  [place] ([address]) . [facility] o Dixon, PA; U[place], MD; Pickard, MD; Tapia, PA o [address], [place], [place] [address] o [phone] o Mon-Fri 8:00-5:00 o Babies seen by providers at [facility] o Accepting Medicaid   [place] ([address]) . [facility] [place] o Masneri, DO; Meyers, MD; Nelson, PA o [address], [place], [place] [address] o [phone]   seen by providers at [facility] o Does NOT accept Medicaid o Limited appointment availability, please call early in hospitalization  . Nature conservation officer at [facility] o [place], DO; Clifton, MD o [address], [place], [place] [address] o [phone] o Mon-Fri 8:00-5:00 o Babies seen by [facility] providers o Does NOT accept Medicaid . [facility] Lorrine Kin, MD; Ninetta Lights, MD; [place], [place]; [place]field, MD o [address], [place], [place] [address] o [phone] o Mon-Fri 8:00-5:00 o After hours clinic [facility] [place], [place] [address]) [phone] Mon-Fri 5:00-8:00, Sat 12:00-6:00, Sun 10:00-4:00 o Babies seen by [facility] providers o Accepting Medicaid . [facility] o [address], [place], [place]  [address] o [phone]   Fax - [phone]  [place] [phone]) . Nature conservation officer at [facility], MD o [address] [place], [place], [place] [address] o [phone] o Mon-Fri 8:00-5:00 o Babies seen by [facility] providers o Does NOT accept Medicaid . [facility]  [place] [facility] [place]) Tomi Likens, MD o [address] [place] [address] [place], [place] [address] o [phone] o Mon-Thur 8:00-7:00, Fri 8:00-5:00, Sat 8:00-12:00 o Babies seen by providers at [facility] [place] o Accepting Medicaid - but does not have vaccinations in office (must be received elsewhere) o Limited availability, please call early in hospitalization  [place] ([phone]) . [facility]  o Wyvonne Lenz, MD o [address] [place] [address] o [phone]  Fax [phone]    Chiropractor - Daron Offer @ [address] and Body - [address], [place]

## 2020-08-03 NOTE — Progress Notes (Signed)
   NURSE VISIT- [facility]  SUBJECTIVE:  Mallory Atkins is a 30 y.o. G66P0000 female here for Panorama NIPT and Horizon Carrier Screening . She is [redacted]w[redacted]d pregnant.   OBJECTIVE:  Appears well, in no apparent distress  Blood work drawn from left [facility] without difficulty. 1 attempt(s).   ASSESSMENT: Pregnancy [redacted]w[redacted]d Panorama NIPT and Horizon Carrier Screening  PLAN: [facility] portal information given and instructed patient how to access results   Jobe Marker  08/03/2020 11:49 AM

## 2020-08-03 NOTE — Progress Notes (Signed)
PRENATAL VISIT NOTE  Subjective:  Mallory Atkins is a 30 y.o. G1P0000 at [redacted]w[redacted]d being seen today for her first prenatal visit for this pregnancy.  She is currently monitored for the following issues for this low-risk pregnancy and has Hidradenitis suppurativa; Supervision of normal first pregnancy; Obesity affecting pregnancy, antepartum; Migraines; and Lateral epicondylitis (tennis elbow) on their problem list.  Patient reports no complaints.  Contractions: Not present. Vag. Bleeding: None.   . Denies leaking of fluid.   She is planning to breastfeed. Desires condoms for contraception.   The following portions of the patient's history were reviewed and updated as appropriate: allergies, current medications, past family history, past medical history, past social history, past surgical history and problem list.   Objective:   Vitals:   08/03/20 1106 08/03/20 1126  BP: (!) 133/93   Pulse: (!) 101   Weight: 290 lb (131.5 kg)   Height:  5\' 2"  (1.575 m)    Fetal Status:           General:  Alert, oriented and cooperative. Patient is in no acute distress.  Skin: Skin is warm and dry. No rash noted.   Cardiovascular: Normal heart rate noted  Respiratory: Normal respiratory effort, no problems with respiration noted.   Abdomen: Soft, gravid, appropriate for gestational age.  Non-tender. Pain/Pressure: Absent     Pelvic: Cervical exam deferred        Extremities: Normal range of motion.  Edema: None  Mental Status: Normal mood and affect. Normal behavior. Normal judgment and thought content.    Pt informed that the ultrasound is considered a limited OB ultrasound and is not intended to be a complete ultrasound exam.  Patient also informed that the ultrasound is not being completed with the intent of assessing for fetal or placental anomalies or any pelvic abnormalities.  Explained that the purpose of today's ultrasound is to assess for  viability.  Patient acknowledges the purpose of the  exam and the limitations of the study. +FM noted and +FHR noted, unable to calculate due to FM, appeared normal.   Assessment and Plan:  Pregnancy: G1P0000 at [redacted]w[redacted]d 1. Encounter for supervision of normal first pregnancy in second trimester - Genetic Screening - Urine Culture - Pain Management Screening Profile (10S) - CHL AMB BABYSCRIPTS SCHEDULE OPTIMIZATION - CBC/D/Plt+RPR+Rh+ABO+Rub Ab... - GC/Chlamydia Probe Amp - POC Urinalysis Dipstick OB - Flu Vaccine QUAD 36+ mos IM (Fluarix, Quad PF) - AFP, Serum, Open Spina Bifida - Peds list given  2. Hidradenitis suppurativa - Infliximab infusions q 6 weeks - Discussed importance of notifying peds after delivery   3. [redacted] weeks gestation of pregnancy - Genetic Screening - Urine Culture - Pain Management Screening Profile (10S) - CHL AMB BABYSCRIPTS SCHEDULE OPTIMIZATION - CBC/D/Plt+RPR+Rh+ABO+Rub Ab... - GC/Chlamydia Probe Amp - POC Urinalysis Dipstick OB - AFP, Serum, Open Spina Bifida  4. Obesity affecting pregnancy, antepartum - BASA recommended daily  - Discussed ideal weight gain of 11-20#  5. Other migraine without status migrainosus, not intractable - No issues since pregnancy, discontinued Topomax with +UPT, has Imitrex PRN  6. Lateral epicondylitis of right elbow - Discontinued Mobic and cortisone injections with +UPT - Recommended chiropractor and/or massage therapy during pregnancy as alternative treatment to medications not recommended in pregnancy   Preterm labor/second trimester warning symptoms and general obstetric precautions including but not limited to vaginal bleeding, contractions, leaking of fluid and fetal movement were reviewed in detail with the patient. Please refer to After Visit Summary for  other counseling recommendations.   Discussed the normal visit cadence for prenatal care Discussed the nature of our practice with multiple providers including residents and students   Return in about 4 weeks  (around 08/31/2020) for LOB, In-Person.  Future Appointments  Date Time Provider Department Center  09/03/2020  2:00 PM [facility] [place] [facility] None  09/03/2020  2:50 PM Cheral Marker, CNM [facility]    Vonzella Nipple, PA-C

## 2020-08-05 LAB — GC/CHLAMYDIA PROBE AMP
Chlamydia trachomatis, NAA: NEGATIVE
Neisseria Gonorrhoeae by PCR: NEGATIVE

## 2020-08-05 LAB — PMP SCREEN PROFILE (10S), URINE
Amphetamine Scrn, Ur: NEGATIVE ng/mL
Cocaine (Metab) Scrn, Ur: NEGATIVE ng/mL
Phencyclidine Qn, Ur: NEGATIVE ng/mL

## 2020-08-05 LAB — MED LIST OPTION NOT SELECTED

## 2020-08-06 LAB — PMP SCREEN PROFILE (10S), URINE
BARBITURATE SCREEN URINE: NEGATIVE ng/mL
BENZODIAZEPINE SCREEN, URINE: NEGATIVE ng/mL
CANNABINOIDS UR QL SCN: NEGATIVE ng/mL
Creatinine(Crt), U: 113.7 mg/dL (ref 20.0–300.0)
Methadone Screen, Urine: NEGATIVE ng/mL
OXYCODONE+OXYMORPHONE UR QL SCN: NEGATIVE ng/mL
Opiate Scrn, Ur: NEGATIVE ng/mL
Ph of Urine: 6 (ref 4.5–8.9)
Propoxyphene Scrn, Ur: NEGATIVE ng/mL

## 2020-08-06 LAB — URINE CULTURE

## 2020-08-24 DIAGNOSIS — Z79899 Other long term (current) drug therapy: Secondary | ICD-10-CM | POA: Diagnosis not present

## 2020-08-24 DIAGNOSIS — L732 Hidradenitis suppurativa: Secondary | ICD-10-CM | POA: Diagnosis not present

## 2020-08-31 ENCOUNTER — Other Ambulatory Visit: Payer: Self-pay | Admitting: Medical

## 2020-08-31 DIAGNOSIS — Z363 Encounter for antenatal screening for malformations: Secondary | ICD-10-CM

## 2020-09-03 ENCOUNTER — Ambulatory Visit (INDEPENDENT_AMBULATORY_CARE_PROVIDER_SITE_OTHER): Payer: BC Managed Care – PPO | Admitting: Women's Health

## 2020-09-03 ENCOUNTER — Other Ambulatory Visit: Payer: Self-pay

## 2020-09-03 ENCOUNTER — Ambulatory Visit (INDEPENDENT_AMBULATORY_CARE_PROVIDER_SITE_OTHER): Payer: BC Managed Care – PPO

## 2020-09-03 ENCOUNTER — Encounter: Payer: Self-pay | Admitting: Women's Health

## 2020-09-03 VITALS — BP 127/75 | HR 96 | Wt 315.8 lb

## 2020-09-03 DIAGNOSIS — Z3402 Encounter for supervision of normal first pregnancy, second trimester: Secondary | ICD-10-CM

## 2020-09-03 DIAGNOSIS — O99210 Obesity complicating pregnancy, unspecified trimester: Secondary | ICD-10-CM

## 2020-09-03 DIAGNOSIS — Z363 Encounter for antenatal screening for malformations: Secondary | ICD-10-CM

## 2020-09-03 DIAGNOSIS — Z1379 Encounter for other screening for genetic and chromosomal anomalies: Secondary | ICD-10-CM | POA: Diagnosis not present

## 2020-09-03 DIAGNOSIS — Z3A15 15 weeks gestation of pregnancy: Secondary | ICD-10-CM | POA: Diagnosis not present

## 2020-09-03 NOTE — Progress Notes (Signed)
   LOW-RISK PREGNANCY VISIT Patient name: Mallory Atkins MRN [id number]  Date of birth: 11/24/1990 Chief Complaint:   Routine Prenatal Visit  History of Present Illness:   [name] is a 30 y.o. G1P0000 female at [redacted]w[redacted]d with an Estimated Date of Delivery: 01/24/21 being seen today for ongoing management of a low-risk pregnancy.  Depression screen [facility] 2/9 08/03/2020 05/17/2020  Decreased Interest 0 0  Down, Depressed, Hopeless 0 0  PHQ - 2 Score 0 0  Altered sleeping 0 0  Tired, decreased energy 1 0  Change in appetite 0 0  Feeling bad or failure about yourself  0 0  Trouble concentrating 0 0  Moving slowly or fidgety/restless 0 0  Suicidal thoughts 0 0  PHQ-9 Score 1 0    Today she reports no complaints. Contractions: Not present. Vag. Bleeding: None.  Movement: Present. denies leaking of fluid. Review of Systems:   Pertinent items are noted in HPI Denies abnormal vaginal discharge w/ itching/odor/irritation, headaches, visual changes, shortness of breath, chest pain, abdominal pain, severe nausea/vomiting, or problems with urination or bowel movements unless otherwise stated above. Pertinent History Reviewed:  Reviewed past medical,surgical, social, obstetrical and family history.  Reviewed problem list, medications and allergies. Physical Assessment:   Vitals:   09/03/20 1509  BP: 127/75  Pulse: 96  Weight: (!) 315 lb 12.8 oz (143.2 kg)  Body mass index is 57.76 kg/m.        Physical Examination:   General appearance: Well appearing, and in no distress  Mental status: Alert, oriented to person, place, and time  Skin: Warm & dry  Cardiovascular: Normal heart rate noted  Respiratory: Normal respiratory effort, no distress  Abdomen: Soft, gravid, nontender  Pelvic: Cervical exam deferred         Extremities: Edema: Trace  Fetal Status:     Movement: Present   [place] 19+4 wks,breech,fundal placenta gr 0,fhr 142 bpm,cx 3.8 cm,SVP of fluid 4.9 cm,normal ovaries,EFW 358 g  90%,anatomy complete  Chaperone: N/A   No results found for this or any previous visit (from the past 24 hour(s)).  Assessment & Plan:  1) Low-risk pregnancy G1P0000 at [redacted]w[redacted]d with an Estimated Date of Delivery: 01/24/21    Meds: No orders of the defined types were placed in this encounter.  Labs/procedures today: U/S and labs  Plan:  Continue routine obstetrical care  Next visit: prefers in person    Reviewed: Preterm labor symptoms and general obstetric precautions including but not limited to vaginal bleeding, contractions, leaking of fluid and fetal movement were reviewed in detail with the patient.  All questions were answered. Has home bp cuff.Check bp weekly, let us know if >140/90.   Follow-up: Return in about 4 weeks (around 10/01/2020) for LROB, CNM, in person.  No future appointments.  Orders Placed This Encounter  Procedures  . AFP, Serum, Open Spina Bifida   Cheral Marker CNM, [facility] 09/03/2020 4:01 PM

## 2020-09-03 NOTE — Patient Instructions (Signed)
Mallory Atkins, I greatly value your feedback.  If you receive a survey following your visit with [place] today, we appreciate you taking the time to fill it out.  Thanks, [name], CNM, WHNP-BC  [facility] ([address] [place], [place] [address]) Entrance C, located off of [address] valet parking  Go to [email or web address] to register for FREE online childbirth classes  [place] Pediatricians/Family Doctors:  [facility] [phone]            [facility] [phone]                 [facility] [phone] (usually not accepting new patients unless you have family there already, you are always welcome to call and ask)       [facility] Department [phone]       [facility] Pediatricians/Family Doctors:   [facility]: [phone]  [facility]: [phone]  [facility]: [phone]  [facility] Doctors:   [facility]: [phone]   [facility]: [phone]  [facility] Doctors:  [facility]: [phone]    Home Blood Pressure Monitoring for Patients   Your provider has recommended that you check your blood pressure (BP) at least once a week at home. If you do not have a blood pressure cuff at home, one will be provided for you. Contact your provider if you have not received your monitor within 1 week.   Helpful Tips for Accurate Home Blood Pressure Checks  . Don't smoke, exercise, or drink caffeine 30 minutes before checking your BP . Use the restroom before checking your BP (a full bladder can raise your pressure) . Relax in a comfortable upright chair . Feet on the ground . Left arm resting comfortably on a flat surface at the level of your heart . Legs uncrossed . Back supported . Sit quietly and don't talk . Place the cuff on your bare arm . Adjust snuggly, so that  only two fingertips can fit between your skin and the top of the cuff . Check 2 readings separated by at least one minute . Keep a log of your BP readings . For a visual, please reference this diagram: http://ccnc.care/bpdiagram  Provider Name: [facility]     Phone: [phone]  Zone 1: ALL CLEAR  Continue to monitor your symptoms:  . BP reading is less than 140 (top number) or less than 90 (bottom number)  . No right upper stomach pain . No headaches or seeing spots . No feeling nauseated or throwing up . No swelling in face and hands  Zone 2: CAUTION Call your doctor's office for any of the following:  . BP reading is greater than 140 (top number) or greater than 90 (bottom number)  . Stomach pain under your ribs in the middle or right side . Headaches or seeing spots . Feeling nauseated or throwing up . Swelling in face and hands  Zone 3: EMERGENCY  Seek immediate medical care if you have any of the following:  . BP reading is greater than160 (top number) or greater than 110 (bottom number) . Severe headaches not improving with Tylenol . Serious difficulty catching your breath . Any worsening symptoms from Zone 2     Second Trimester of Pregnancy The second trimester is from week 14 through week 27 (months 4 through 6). The second trimester is often a time when you feel your best.  Your body has adjusted to being pregnant, and you begin to feel better physically. Usually, morning sickness has lessened or quit completely, you may have more energy, and you may have an increase in appetite. The second trimester is also a time when the fetus is growing rapidly. At the end of the sixth month, the fetus is about 9 inches long and weighs about 1 pounds. You will likely begin to feel the baby move (quickening) between 16 and 20 weeks of pregnancy. Body changes during your second trimester Your body continues to go through many changes during your second trimester. The changes  vary from woman to woman.  Your weight will continue to increase. You will notice your lower abdomen bulging out.  You may begin to get stretch marks on your hips, abdomen, and breasts.  You may develop headaches that can be relieved by medicines. The medicines should be approved by your health care provider.  You may urinate more often because the fetus is pressing on your bladder.  You may develop or continue to have heartburn as a result of your pregnancy.  You may develop constipation because certain hormones are causing the muscles that push waste through your intestines to slow down.  You may develop hemorrhoids or swollen, bulging veins (varicose veins).  You may have back pain. This is caused by: ? Weight gain. ? Pregnancy hormones that are relaxing the joints in your pelvis. ? A shift in weight and the muscles that support your balance.  Your breasts will continue to grow and they will continue to become tender.  Your gums may bleed and may be sensitive to brushing and flossing.  Dark spots or blotches (chloasma, mask of pregnancy) may develop on your face. This will likely fade after the baby is born.  A dark line from your belly button to the pubic area (linea nigra) may appear. This will likely fade after the baby is born.  You may have changes in your hair. These can include thickening of your hair, rapid growth, and changes in texture. Some women also have hair loss during or after pregnancy, or hair that feels dry or thin. Your hair will most likely return to normal after your baby is born.  What to expect at prenatal visits During a routine prenatal visit:  You will be weighed to make sure you and the fetus are growing normally.  Your blood pressure will be taken.  Your abdomen will be measured to track your baby's growth.  The fetal heartbeat will be listened to.  Any test results from the previous visit will be discussed.  Your health care provider may  ask you:  How you are feeling.  If you are feeling the baby move.  If you have had any abnormal symptoms, such as leaking fluid, bleeding, severe headaches, or abdominal cramping.  If you are using any tobacco products, including cigarettes, chewing tobacco, and electronic cigarettes.  If you have any questions.  Other tests that may be performed during your second trimester include:  Blood tests that check for: ? Low iron levels (anemia). ? High blood sugar that affects pregnant women (gestational diabetes) between 73 and 28 weeks. ? Rh antibodies. This is to check for a protein on red blood cells (Rh factor).  Urine tests to check for infections, diabetes, or protein in the urine.  An ultrasound to confirm the proper growth and development of the baby.  An amniocentesis to check for possible genetic problems.  Fetal screens  for spina bifida and Down syndrome.  HIV (human immunodeficiency virus) testing. Routine prenatal testing includes screening for HIV, unless you choose not to have this test.  Follow these instructions at home: Medicines  Follow your health care provider's instructions regarding medicine use. Specific medicines may be either safe or unsafe to take during pregnancy.  Take a prenatal vitamin that contains at least 600 micrograms (mcg) of folic acid.  If you develop constipation, try taking a stool softener if your health care provider approves. Eating and drinking  Eat a balanced diet that includes fresh fruits and vegetables, whole grains, good sources of protein such as meat, eggs, or tofu, and low-fat dairy. Your health care provider will help you determine the amount of weight gain that is right for you.  Avoid raw meat and uncooked cheese. These carry germs that can cause birth defects in the baby.  If you have low calcium intake from food, talk to your health care provider about whether you should take a daily calcium supplement.  Limit foods  that are high in fat and processed sugars, such as fried and sweet foods.  To prevent constipation: ? Drink enough fluid to keep your urine clear or pale yellow. ? Eat foods that are high in fiber, such as fresh fruits and vegetables, whole grains, and beans. Activity  Exercise only as directed by your health care provider. Most women can continue their usual exercise routine during pregnancy. Try to exercise for 30 minutes at least 5 days a week. Stop exercising if you experience uterine contractions.  Avoid heavy lifting, wear low heel shoes, and practice good posture.  A sexual relationship may be continued unless your health care provider directs you otherwise. Relieving pain and discomfort  Wear a good support bra to prevent discomfort from breast tenderness.  Take warm sitz baths to soothe any pain or discomfort caused by hemorrhoids. Use hemorrhoid cream if your health care provider approves.  Rest with your legs elevated if you have leg cramps or low back pain.  If you develop varicose veins, wear support hose. Elevate your feet for 15 minutes, 3-4 times a day. Limit salt in your diet. Prenatal Care  Write down your questions. Take them to your prenatal visits.  Keep all your prenatal visits as told by your health care provider. This is important. Safety  Wear your seat belt at all times when driving.  Make a list of emergency phone numbers, including numbers for family, friends, the hospital, and police and fire departments. General instructions  Ask your health care provider for a referral to a local prenatal education class. Begin classes no later than the beginning of month 6 of your pregnancy.  Ask for help if you have counseling or nutritional needs during pregnancy. Your health care provider can offer advice or refer you to specialists for help with various needs.  Do not use hot tubs, steam rooms, or saunas.  Do not douche or use tampons or scented sanitary  pads.  Do not cross your legs for long periods of time.  Avoid cat litter boxes and soil used by cats. These carry germs that can cause birth defects in the baby and possibly loss of the fetus by miscarriage or stillbirth.  Avoid all smoking, herbs, alcohol, and unprescribed drugs. Chemicals in these products can affect the formation and growth of the baby.  Do not use any products that contain nicotine or tobacco, such as cigarettes and e-cigarettes. If you need help quitting,  ask your health care provider.  Visit your dentist if you have not gone yet during your pregnancy. Use a soft toothbrush to brush your teeth and be gentle when you floss. Contact a health care provider if:  You have dizziness.  You have mild pelvic cramps, pelvic pressure, or nagging pain in the abdominal area.  You have persistent nausea, vomiting, or diarrhea.  You have a bad smelling vaginal discharge.  You have pain when you urinate. Get help right away if:  You have a fever.  You are leaking fluid from your vagina.  You have spotting or bleeding from your vagina.  You have severe abdominal cramping or pain.  You have rapid weight gain or weight loss.  You have shortness of breath with chest pain.  You notice sudden or extreme swelling of your face, hands, ankles, feet, or legs.  You have not felt your baby move in over an hour.  You have severe headaches that do not go away when you take medicine.  You have vision changes. Summary  The second trimester is from week 14 through week 27 (months 4 through 6). It is also a time when the fetus is growing rapidly.  Your body goes through many changes during pregnancy. The changes vary from woman to woman.  Avoid all smoking, herbs, alcohol, and unprescribed drugs. These chemicals affect the formation and growth your baby.  Do not use any tobacco products, such as cigarettes, chewing tobacco, and e-cigarettes. If you need help quitting, ask your  health care provider.  Contact your health care provider if you have any questions. Keep all prenatal visits as told by your health care provider. This is important. This information is not intended to replace advice given to you by your health care provider. Make sure you discuss any questions you have with your health care provider. Document Released: 06/17/2001 Document Revised: 11/29/2015 Document Reviewed: 08/24/2012 Elsevier Interactive Patient Education  2017 [employer].

## 2020-09-03 NOTE — Progress Notes (Addendum)
[place] 19+4 wks,breech,fundal placenta gr 0,fhr 142 bpm,cx 3.8 cm,SVP of fluid 4.9 cm,normal ovaries,EFW 358 g 90%,anaotmy complete

## 2020-09-04 LAB — CBC/D/PLT+RPR+RH+ABO+RUB AB...
Antibody Screen: NEGATIVE
Basophils Absolute: 0.1 10*3/uL (ref 0.0–0.2)
Basos: 1 %
EOS (ABSOLUTE): 0.2 10*3/uL (ref 0.0–0.4)
Eos: 1 %
HCV Ab: <0.1 s/co ratio (ref 0.0–0.9)
HIV Screen 4th Generation wRfx: NONREACTIVE
Hematocrit: 34.1 % (ref 34.0–46.6)
Hemoglobin: 11.5 g/dL (ref 11.1–15.9)
Hepatitis B Surface Ag: NEGATIVE
Immature Grans (Abs): 0.1 10*3/uL (ref 0.0–0.1)
Immature Granulocytes: 1 %
Lymphocytes Absolute: 3.2 10*3/uL — ABNORMAL HIGH (ref 0.7–3.1)
Lymphs: 29 %
MCH: 30.1 pg (ref 26.6–33.0)
MCHC: 33.7 g/dL (ref 31.5–35.7)
MCV: 89 fL (ref 79–97)
Monocytes Absolute: 0.6 10*3/uL (ref 0.1–0.9)
Monocytes: 5 %
Neutrophils Absolute: 7 10*3/uL (ref 1.4–7.0)
Neutrophils: 63 %
Platelets: 248 10*3/uL (ref 150–450)
RBC: 3.82 x10E6/uL (ref 3.77–5.28)
RDW: 12.4 % (ref 11.7–15.4)
RPR Ser Ql: NONREACTIVE
Rh Factor: NEGATIVE
Rubella Antibodies, IGG: 2.89 index (ref >0.99)
WBC: 11.1 10*3/uL — ABNORMAL HIGH (ref 3.4–10.8)

## 2020-09-04 LAB — HCV INTERPRETATION

## 2020-09-05 LAB — AFP, SERUM, OPEN SPINA BIFIDA
AFP MoM: 0.87
AFP Value: 31 ng/mL
Gest. Age on Collection Date: 19.6 weeks
Maternal Age At EDD: 32.4 yr
OSBR Risk 1 IN: 10000
Test Results:: NEGATIVE
Weight: 316 [lb_av]

## 2020-09-10 ENCOUNTER — Other Ambulatory Visit: Payer: Self-pay | Admitting: Women's Health

## 2020-09-10 MED ORDER — ONDANSETRON HCL 4 MG PO TABS: 4.0000 mg | ORAL_TABLET | Freq: Three times a day (TID) | ORAL | 0 refills | Status: DC | PRN

## 2020-09-20 ENCOUNTER — Other Ambulatory Visit: Payer: Self-pay | Admitting: Women's Health

## 2020-09-20 MED ORDER — ACYCLOVIR 400 MG PO TABS: 400.0000 mg | ORAL_TABLET | Freq: Three times a day (TID) | ORAL | 3 refills | Status: DC

## 2020-10-01 ENCOUNTER — Encounter: Payer: Self-pay | Admitting: Women's Health

## 2020-10-01 ENCOUNTER — Other Ambulatory Visit: Payer: Self-pay

## 2020-10-01 ENCOUNTER — Ambulatory Visit (INDEPENDENT_AMBULATORY_CARE_PROVIDER_SITE_OTHER): Payer: BC Managed Care – PPO | Admitting: Women's Health

## 2020-10-01 VITALS — BP 119/76 | HR 87 | Wt 321.2 lb

## 2020-10-01 DIAGNOSIS — Z3402 Encounter for supervision of normal first pregnancy, second trimester: Secondary | ICD-10-CM

## 2020-10-01 NOTE — Progress Notes (Signed)
LOW-RISK PREGNANCY VISIT Patient name: Mallory Atkins MRN [id number]  Date of birth: 1991-03-26 Chief Complaint:   Routine Prenatal Visit  History of Present Illness:   Mallory Atkins is a 31 y.o. G4P0000 female at [redacted]w[redacted]d with an Estimated Date of Delivery: 01/24/21 being seen today for ongoing management of a low-risk pregnancy.  Depression screen [facility] 2/9 08/03/2020 05/17/2020  Decreased Interest 0 0  Down, Depressed, Hopeless 0 0  PHQ - 2 Score 0 0  Altered sleeping 0 0  Tired, decreased energy 1 0  Change in appetite 0 0  Feeling bad or failure about yourself  0 0  Trouble concentrating 0 0  Moving slowly or fidgety/restless 0 0  Suicidal thoughts 0 0  PHQ-9 Score 1 0    Today she reports no complaints.Takes imitrex for migraine maybe once/mth if that. Still receiving infliximab infusions at [facility] for [facility]. Interested in Systems developer.  Contractions: Not present. Vag. Bleeding: None.  Movement: Present. denies leaking of fluid. Review of Systems:   Pertinent items are noted in HPI Denies abnormal vaginal discharge w/ itching/odor/irritation, headaches, visual changes, shortness of breath, chest pain, abdominal pain, severe nausea/vomiting, or problems with urination or bowel movements unless otherwise stated above. Pertinent History Reviewed:  Reviewed past medical,surgical, social, obstetrical and family history.  Reviewed problem list, medications and allergies. Physical Assessment:   Vitals:   10/01/20 1514  BP: 119/76  Pulse: 87  Weight: (!) 321 lb 3.2 oz (145.7 kg)  Body mass index is 58.75 kg/m.        Physical Examination:   General appearance: Well appearing, and in no distress  Mental status: Alert, oriented to person, place, and time  Skin: Warm & dry  Cardiovascular: Normal heart rate noted  Respiratory: Normal respiratory effort, no distress  Abdomen: Soft, gravid, nontender  Pelvic: Cervical exam deferred         Extremities: Edema: None  Fetal Status: Fetal  Heart Rate (bpm): 150 Fundal Height: 25 cm Movement: Present    Chaperone: N/A   No results found for this or any previous visit (from the past 24 hour(s)).  Assessment & Plan:  1) Low-risk pregnancy G1P0000 at [redacted]w[redacted]d with an Estimated Date of Delivery: 01/24/21   2) HS, on infliximab infusions q 6wks at [facility], discussed not a lot of evidence in pregnancy, some evidence of medicine still in babies blood at of age, can increase r/f infection/inability to fight off infection. Discuss w/ MD at next visit if ok to remain on, or if should stop prior to delivery  3) Interested in waterbirth> discussed, sign up for class   Meds: No orders of the defined types were placed in this encounter.  Labs/procedures today: none  Plan:  Continue routine obstetrical care  Next visit: prefers will be in person for pn2    Reviewed: Preterm labor symptoms and general obstetric precautions including but not limited to vaginal bleeding, contractions, leaking of fluid and fetal movement were reviewed in detail with the patient.  All questions were answered. Does have home bp cuff. Office bp cuff given: not applicable. Check bp weekly, let us know if consistently >140 and/or >90.  Follow-up: Return in about 4 weeks (around 10/29/2020) for LROB, PN2, in person, MD.  Future Appointments  Date Time Provider Department Center  10/30/2020  8:30 AM [facility]  10/30/2020  8:50 AM Eure, Amaryllis Dyke, MD [facility]    No orders of the defined types were placed in  this encounter.  Cheral Marker CNM, [facility] 10/01/2020 3:55 PM

## 2020-10-01 NOTE — Patient Instructions (Addendum)
Mallory Atkins, I greatly value your feedback.  If you receive a survey following your visit with [place] today, we appreciate you taking the time to fill it out.  Thanks, [name], CNM, WHNP-BC   You will have your sugar test next visit.  Please do not eat or drink anything after midnight the night before you come, not even water.  You will be here for at least two hours.  Please make an appointment online for the bloodwork at [email or web address] for 8:30am (or as close to this as possible). Make sure you select the [facility] service center. The day of the appointment, check in with our office first, then you will go to [facility] to start the sugar test.    [facility] at [facility] [place], [place] [address]) Entrance C, located off of [address] valet parking  Go to [email or web address] to register for FREE online childbirth classes   Call the office [phone]) or go to [facility] if:  You begin to have strong, frequent contractions  Your water breaks.  Sometimes it is a big gush of fluid, sometimes it is just a trickle that keeps getting your panties wet or running down your legs  You have vaginal bleeding.  It is normal to have a small amount of spotting if your cervix was checked.   You don't feel your baby moving like normal.  If you don't, get you something to eat and drink and lay down and focus on feeling your baby move.   If your baby is still not moving like normal, you should call the office or go to [facility].  [place] Pediatricians/Family Doctors:  [facility] [phone]            [facility] [phone]                 [facility] [phone] (usually not accepting new patients unless you have family there already, you are always welcome to call and ask)       [facility] Department [phone]       [facility] Pediatricians/Family Doctors:   [facility]:  [phone]  [facility]: [phone]  [facility]: [phone]  [facility] Doctors:   [facility]: [phone]   [facility]: [phone]  [facility] Doctors:  [facility]: [phone]   Home Blood Pressure Monitoring for Patients   Your provider has recommended that you check your blood pressure (BP) at least once a week at home. If you do not have a blood pressure cuff at home, one will be provided for you. Contact your provider if you have not received your monitor within 1 week.   Helpful Tips for Accurate Home Blood Pressure Checks  . Don't smoke, exercise, or drink caffeine 30 minutes before checking your BP . Use the restroom before checking your BP (a full bladder can raise your pressure) . Relax in a comfortable upright chair . Feet on the ground . Left arm resting comfortably on a flat surface at the level of your heart . Legs uncrossed . Back supported . Sit quietly and don't talk . Place the cuff on your bare arm . Adjust snuggly, so that only two fingertips can fit between your skin and the top of the cuff . Check 2 readings separated by at least one minute . Keep a log of your BP readings . For a visual, please reference  this diagram: http://ccnc.care/bpdiagram  Provider Name: [facility]     Phone: [phone]  Zone 1: ALL CLEAR  Continue to monitor your symptoms:  . BP reading is less than 140 (top number) or less than 90 (bottom number)  . No right upper stomach pain . No headaches or seeing spots . No feeling nauseated or throwing up . No swelling in face and hands  Zone 2: CAUTION Call your doctor's office for any of the following:  . BP reading is greater than 140 (top number) or greater than 90 (bottom number)  . Stomach pain under your ribs in the middle or right side . Headaches or seeing spots . Feeling nauseated or throwing up . Swelling in  face and hands  Zone 3: EMERGENCY  Seek immediate medical care if you have any of the following:  . BP reading is greater than160 (top number) or greater than 110 (bottom number) . Severe headaches not improving with Tylenol . Serious difficulty catching your breath . Any worsening symptoms from Zone 2   Second Trimester of Pregnancy The second trimester is from week 13 through week 28, months 4 through 6. The second trimester is often a time when you feel your best. Your body has also adjusted to being pregnant, and you begin to feel better physically. Usually, morning sickness has lessened or quit completely, you may have more energy, and you may have an increase in appetite. The second trimester is also a time when the fetus is growing rapidly. At the end of the sixth month, the fetus is about 9 inches long and weighs about 1 pounds. You will likely begin to feel the baby move (quickening) between 18 and 20 weeks of the pregnancy. BODY CHANGES Your body goes through many changes during pregnancy. The changes vary from woman to woman.   Your weight will continue to increase. You will notice your lower abdomen bulging out.  You may begin to get stretch marks on your hips, abdomen, and breasts.  You may develop headaches that can be relieved by medicines approved by your health care provider.  You may urinate more often because the fetus is pressing on your bladder.  You may develop or continue to have heartburn as a result of your pregnancy.  You may develop constipation because certain hormones are causing the muscles that push waste through your intestines to slow down.  You may develop hemorrhoids or swollen, bulging veins (varicose veins).  You may have back pain because of the weight gain and pregnancy hormones relaxing your joints between the bones in your pelvis and as a result of a shift in weight and the muscles that support your balance.  Your breasts will continue to grow  and be tender.  Your gums may bleed and may be sensitive to brushing and flossing.  Dark spots or blotches (chloasma, mask of pregnancy) may develop on your face. This will likely fade after the baby is born.  A dark line from your belly button to the pubic area (linea nigra) may appear. This will likely fade after the baby is born.  You may have changes in your hair. These can include thickening of your hair, rapid growth, and changes in texture. Some women also have hair loss during or after pregnancy, or hair that feels dry or thin. Your hair will most likely return to normal after your baby is born. WHAT TO EXPECT AT YOUR PRENATAL VISITS During a routine prenatal visit:  You will  be weighed to make sure you and the fetus are growing normally.  Your blood pressure will be taken.  Your abdomen will be measured to track your baby's growth.  The fetal heartbeat will be listened to.  Any test results from the previous visit will be discussed. Your health care provider may ask you:  How you are feeling.  If you are feeling the baby move.  If you have had any abnormal symptoms, such as leaking fluid, bleeding, severe headaches, or abdominal cramping.  If you have any questions. Other tests that may be performed during your second trimester include:  Blood tests that check for:  Low iron levels (anemia).  Gestational diabetes (between 24 and 28 weeks).  Rh antibodies.  Urine tests to check for infections, diabetes, or protein in the urine.  An ultrasound to confirm the proper growth and development of the baby.  An amniocentesis to check for possible genetic problems.  Fetal screens for spina bifida and Down syndrome. HOME CARE INSTRUCTIONS   Avoid all smoking, herbs, alcohol, and unprescribed drugs. These chemicals affect the formation and growth of the baby.  Follow your health care provider's instructions regarding medicine use. There are medicines that are either  safe or unsafe to take during pregnancy.  Exercise only as directed by your health care provider. Experiencing uterine cramps is a good sign to stop exercising.  Continue to eat regular, healthy meals.  Wear a good support bra for breast tenderness.  Do not use hot tubs, steam rooms, or saunas.  Wear your seat belt at all times when driving.  Avoid raw meat, uncooked cheese, cat litter boxes, and soil used by cats. These carry germs that can cause birth defects in the baby.  Take your prenatal vitamins.  Try taking a stool softener (if your health care provider approves) if you develop constipation. Eat more high-fiber foods, such as fresh vegetables or fruit and whole grains. Drink plenty of fluids to keep your urine clear or pale yellow.  Take warm sitz baths to soothe any pain or discomfort caused by hemorrhoids. Use hemorrhoid cream if your health care provider approves.  If you develop varicose veins, wear support hose. Elevate your feet for 15 minutes, 3-4 times a day. Limit salt in your diet.  Avoid heavy lifting, wear low heel shoes, and practice good posture.  Rest with your legs elevated if you have leg cramps or low back pain.  Visit your dentist if you have not gone yet during your pregnancy. Use a soft toothbrush to brush your teeth and be gentle when you floss.  A sexual relationship may be continued unless your health care provider directs you otherwise.  Continue to go to all your prenatal visits as directed by your health care provider. SEEK MEDICAL CARE IF:   You have dizziness.  You have mild pelvic cramps, pelvic pressure, or nagging pain in the abdominal area.  You have persistent nausea, vomiting, or diarrhea.  You have a bad smelling vaginal discharge.  You have pain with urination. SEEK IMMEDIATE MEDICAL CARE IF:   You have a fever.  You are leaking fluid from your vagina.  You have spotting or bleeding from your vagina.  You have severe  abdominal cramping or pain.  You have rapid weight gain or loss.  You have shortness of breath with chest pain.  You notice sudden or extreme swelling of your face, hands, ankles, feet, or legs.  You have not felt your baby move in  over an hour.  You have severe headaches that do not go away with medicine.  You have vision changes. Document Released: 06/17/2001 Document Revised: 06/28/2013 Document Reviewed: 08/24/2012 [facility] Patient Information 2015 [place], [place]. This information is not intended to replace advice given to you by your health care provider. Make sure you discuss any questions you have with your health care provider.   Considering Waterbirth? Guide for patients at Center for [facility]) Why consider waterbirth? . Gentle birth for babies  . Less pain medicine used in labor  . May allow for passive descent/less pushing  . May reduce perineal tears  . More mobility and instinctive maternal position changes  . Increased maternal relaxation   Is waterbirth safe? What are the risks of infection, drowning or other complications? . Infection:  Marland Kitchen Very low risk (3.7 % for tub vs 4.8% for bed)  . 7 in 8000 waterbirths with documented infection  . Poorly cleaned equipment most common cause  . Slightly lower group B strep transmission rate  . Drowning  . Maternal:  . Very low risk  . Related to seizures or fainting  . Newborn:  Marland Kitchen Very low risk. No evidence of increased risk of respiratory problems in multiple large studies  . Physiological protection from breathing under water  . Avoid underwater birth if there are any fetal complications  . Once baby's head is out of the water, keep it out.  . Birth complication  . Some reports of cord trauma, but risk decreased by bringing baby to surface gradually  . No evidence of increased risk of shoulder dystocia. Mothers can usually change positions faster in water than in a bed, possibly aiding the maneuvers to  free the shoulder.   There are 2 things you MUST do to have a waterbirth with [facility]: 1. Attend a waterbirth class at [facility]   a. 3rd Wednesday of every month from 7-9 pm (virtual during COVID) b. Free c. Register online at [email or web address] or [email or web address] or by calling [phone] d. Bring [place] the certificate from the class to your prenatal appointment or send via MyChart 2. Meet with a midwife at 36 weeks* to see if you can still plan a waterbirth and to sign the consent.   *We also recommend that you schedule as many of your prenatal visits with a midwife as possible.    Helpful information: . You may want to bring a bathing suit top to the hospital to wear during labor but this is optional.  All other supplies are provided by the hospital. . Please arrive at the hospital with signs of active labor, and do not wait at home until late in labor. It takes 45 min- 2 hours for COVID testing, fetal monitoring, and check in to your room to take place, plus transport and filling of the waterbirth tub.    Things that would prevent you from having a waterbirth: . Unknown or Positive COVID-19 diagnosis upon admission to hospital* . Premature, <37wks  . Previous cesarean birth  . Presence of thick meconium-stained fluid  . Multiple gestation (Twins, triplets, etc.)  . Uncontrolled diabetes or gestational diabetes requiring medication  . Hypertension diagnosed in pregnancy or preexisting hypertension (gestational hypertension, preeclampsia, or chronic hypertension) . Heavy vaginal bleeding  . Non-reassuring fetal heart rate  . Active infection (MRSA, etc.). Group B Strep is NOT a contraindication for waterbirth.  . If your labor has to be induced and induction method requires  continuous monitoring of the baby's heart rate  . Other risks/issues identified by your obstetrical provider   Please remember that birth is unpredictable. Under certain  unforeseeable circumstances your provider may advise against giving birth in the tub. These decisions will be made on a case-by-case basis and with the safety of you and your baby as our highest priority.   *Please remember that in order to have a waterbirth, you must test Negative to COVID-19 upon admission to the hospital.  Updated 05/22/2020

## 2020-10-05 DIAGNOSIS — Z79899 Other long term (current) drug therapy: Secondary | ICD-10-CM | POA: Diagnosis not present

## 2020-10-05 DIAGNOSIS — L732 Hidradenitis suppurativa: Secondary | ICD-10-CM | POA: Diagnosis not present

## 2020-10-24 DIAGNOSIS — Z3483 Encounter for supervision of other normal pregnancy, third trimester: Secondary | ICD-10-CM | POA: Diagnosis not present

## 2020-10-24 DIAGNOSIS — Z3482 Encounter for supervision of other normal pregnancy, second trimester: Secondary | ICD-10-CM | POA: Diagnosis not present

## 2020-10-30 ENCOUNTER — Other Ambulatory Visit: Payer: Self-pay

## 2020-10-30 ENCOUNTER — Encounter: Payer: Self-pay | Admitting: Obstetrics & Gynecology

## 2020-10-30 ENCOUNTER — Other Ambulatory Visit: Payer: BC Managed Care – PPO

## 2020-10-30 ENCOUNTER — Ambulatory Visit (INDEPENDENT_AMBULATORY_CARE_PROVIDER_SITE_OTHER): Payer: BC Managed Care – PPO | Admitting: Obstetrics & Gynecology

## 2020-10-30 VITALS — BP 127/87 | HR 96 | Wt 329.0 lb

## 2020-10-30 DIAGNOSIS — Z23 Encounter for immunization: Secondary | ICD-10-CM | POA: Diagnosis not present

## 2020-10-30 DIAGNOSIS — R030 Elevated blood-pressure reading, without diagnosis of hypertension: Secondary | ICD-10-CM

## 2020-10-30 DIAGNOSIS — Z3402 Encounter for supervision of normal first pregnancy, second trimester: Secondary | ICD-10-CM | POA: Diagnosis not present

## 2020-10-30 DIAGNOSIS — Z3A27 27 weeks gestation of pregnancy: Secondary | ICD-10-CM

## 2020-10-30 DIAGNOSIS — Z131 Encounter for screening for diabetes mellitus: Secondary | ICD-10-CM

## 2020-10-30 DIAGNOSIS — Z3403 Encounter for supervision of normal first pregnancy, third trimester: Secondary | ICD-10-CM

## 2020-10-30 LAB — OB RESULTS CONSOLE HIV ANTIBODY (ROUTINE TESTING): HIV: NONREACTIVE

## 2020-10-30 NOTE — Progress Notes (Signed)
   LOW-RISK PREGNANCY VISIT Patient name: Mallory Atkins MRN [id number]  Date of birth: [birth year] Chief Complaint:   Routine Prenatal Visit  History of Present Illness:   Mallory Atkins is a 30 y.o. G43P0000 female at [redacted]w[redacted]d with an Estimated Date of Delivery: 01/24/21 being seen today for ongoing management of a low-risk pregnancy.  Depression screen [facility] 2/9 10/30/2020 08/03/2020 05/17/2020  Decreased Interest 0 0 0  Down, Depressed, Hopeless 0 0 0  PHQ - 2 Score 0 0 0  Altered sleeping 0 0 0  Tired, decreased energy 1 1 0  Change in appetite 0 0 0  Feeling bad or failure about yourself  0 0 0  Trouble concentrating 0 0 0  Moving slowly or fidgety/restless 0 0 0  Suicidal thoughts 0 0 0  PHQ-9 Score 1 1 0    Today she reports no complaints. Contractions: Not present. Vag. Bleeding: None.  Movement: Present. denies leaking of fluid. Review of Systems:   Pertinent items are noted in HPI Denies abnormal vaginal discharge w/ itching/odor/irritation, headaches, visual changes, shortness of breath, chest pain, abdominal pain, severe nausea/vomiting, or problems with urination or bowel movements unless otherwise stated above. Pertinent History Reviewed:  Reviewed past medical,surgical, social, obstetrical and family history.  Reviewed problem list, medications and allergies. Physical Assessment:   Vitals:   10/30/20 0924  BP: 127/87  Pulse: 96  Weight: (!) 329 lb (149.2 kg)  Body mass index is 60.17 kg/m.        Physical Examination:   General appearance: Well appearing, and in no distress  Mental status: Alert, oriented to person, place, and time  Skin: Warm & dry  Cardiovascular: Normal heart rate noted  Respiratory: Normal respiratory effort, no distress  Abdomen: Soft, gravid, nontender  Pelvic: Cervical exam deferred         Extremities: Edema: Trace  Fetal Status: Fetal Heart Rate (bpm): 151 Fundal Height: 12 cm Movement: Present    Chaperone: n/a    No results found for  this or any previous visit (from the past 24 hour(s)).  Assessment & Plan:  1) Low-risk pregnancy G1P0000 at [redacted]w[redacted]d with an Estimated Date of Delivery: 01/24/21   2) HS on biologics,   3) A negative: Rhogan if antibody screen is negative   Meds: No orders of the defined types were placed in this encounter.  Labs/procedures today:   Plan:  Continue routine obstetrical care  Next visit: prefers in person    Reviewed: Preterm labor symptoms and general obstetric precautions including but not limited to vaginal bleeding, contractions, leaking of fluid and fetal movement were reviewed in detail with the patient.  All questions were answered. Has home bp cuff. Rx faxed to . Check bp weekly, let us know if >140/90.   Follow-up: Return in about 1 week (around 11/06/2020) for RhoGam, 2 weeks for BP check.  Orders Placed This Encounter  Procedures  . Tdap vaccine greater than or equal to 7yo IM    Lazaro Arms, MD 10/30/2020 10:02 AM

## 2020-10-31 LAB — HIV ANTIBODY (ROUTINE TESTING W REFLEX): HIV Screen 4th Generation wRfx: NONREACTIVE

## 2020-10-31 LAB — CBC
Hematocrit: 33.8 % — ABNORMAL LOW (ref 34.0–46.6)
Hemoglobin: 11.2 g/dL (ref 11.1–15.9)
MCH: 29.6 pg (ref 26.6–33.0)
MCHC: 33.1 g/dL (ref 31.5–35.7)
MCV: 89 fL (ref 79–97)
Platelets: 211 10*3/uL (ref 150–450)
RBC: 3.78 x10E6/uL (ref 3.77–5.28)
RDW: 12.7 % (ref 11.7–15.4)
WBC: 12.3 10*3/uL — ABNORMAL HIGH (ref 3.4–10.8)

## 2020-10-31 LAB — ANTIBODY SCREEN: Antibody Screen: NEGATIVE

## 2020-10-31 LAB — RPR: RPR Ser Ql: NONREACTIVE

## 2020-10-31 LAB — GLUCOSE TOLERANCE, 2 HOURS W/ 1HR
Glucose, 1 hour: 143 mg/dL (ref 65–179)
Glucose, 2 hour: 116 mg/dL (ref 65–152)
Glucose, Fasting: 82 mg/dL (ref 65–91)

## 2020-11-05 ENCOUNTER — Ambulatory Visit (INDEPENDENT_AMBULATORY_CARE_PROVIDER_SITE_OTHER): Payer: BC Managed Care – PPO

## 2020-11-05 ENCOUNTER — Other Ambulatory Visit: Payer: Self-pay

## 2020-11-05 VITALS — BP 136/82 | HR 104 | Wt 335.4 lb

## 2020-11-05 DIAGNOSIS — O26893 Other specified pregnancy related conditions, third trimester: Secondary | ICD-10-CM | POA: Diagnosis not present

## 2020-11-05 DIAGNOSIS — Z6791 Unspecified blood type, Rh negative: Secondary | ICD-10-CM

## 2020-11-05 NOTE — Progress Notes (Signed)
   NURSE VISIT- INJECTION  SUBJECTIVE:  Mallory Atkins is a 30 y.o. G25P0000 female here for a Rhophylac for Rh neg status during pregnancy. She is [redacted]w[redacted]d pregnant.   OBJECTIVE:  BP 136/82   Pulse (!) 104   Wt (!) 335 lb 6.4 oz (152.1 kg)   LMP 04/19/2020 (Exact Date)   BMI 61.35 kg/m   Appears well, in no apparent distress  Injection administered in: Right upper quad. gluteus  No orders of the defined types were placed in this encounter.   ASSESSMENT: Pregnancy [redacted]w[redacted]d Rhophylac for Rh neg status during pregnancy PLAN: Follow-up: as scheduled   Kyrie Bun A Kalim Kissel  11/05/2020 3:28 PM

## 2020-11-06 ENCOUNTER — Ambulatory Visit: Payer: BC Managed Care – PPO

## 2020-11-12 ENCOUNTER — Ambulatory Visit (INDEPENDENT_AMBULATORY_CARE_PROVIDER_SITE_OTHER): Payer: BC Managed Care – PPO

## 2020-11-12 ENCOUNTER — Other Ambulatory Visit: Payer: Self-pay

## 2020-11-12 VITALS — BP 137/89 | HR 107

## 2020-11-12 DIAGNOSIS — R030 Elevated blood-pressure reading, without diagnosis of hypertension: Secondary | ICD-10-CM

## 2020-11-12 NOTE — Progress Notes (Addendum)
   NURSE VISIT- BLOOD PRESSURE CHECK  SUBJECTIVE:  Mallory Atkins is a 30 y.o. G54P0000 female here for BP check. She is [redacted]w[redacted]d pregnant    HYPERTENSION ROS:  Pregnant/postpartum:  . Severe headaches that don't go away with tylenol/other medicines: No  . Visual changes (seeing spots/double/blurred vision) No  . Severe pain under right breast breast or in center of upper chest No  . Severe nausea/vomiting No  . Taking medicines as instructed not applicable  OBJECTIVE:  BP 137/89 (BP Location: Right Arm, Patient Position: Sitting, Cuff Size: Normal)   Pulse (!) 107   LMP 04/19/2020 (Exact Date)   Appearance alert, well appearing, and in no distress and oriented to person, place, and time.  ASSESSMENT: Pregnancy [redacted]w[redacted]d  blood pressure check  PLAN: Discussed with [name], CNM, [facility]   Recommendations: no changes needed   Follow-up: as scheduled   Cuma Polyakov A Lilliann Rossetti  11/13/2020 2:31 PM   Chart reviewed for nurse visit. Agree with plan of care.  Cheral Marker, [place] 11/13/2020 4:50 PM

## 2020-11-19 ENCOUNTER — Ambulatory Visit (INDEPENDENT_AMBULATORY_CARE_PROVIDER_SITE_OTHER): Payer: BC Managed Care – PPO | Admitting: Women's Health

## 2020-11-19 ENCOUNTER — Other Ambulatory Visit: Payer: Self-pay

## 2020-11-19 ENCOUNTER — Encounter: Payer: Self-pay | Admitting: Women's Health

## 2020-11-19 VITALS — BP 132/85 | HR 98 | Wt 340.4 lb

## 2020-11-19 DIAGNOSIS — Z3403 Encounter for supervision of normal first pregnancy, third trimester: Secondary | ICD-10-CM

## 2020-11-19 NOTE — Progress Notes (Signed)
   LOW-RISK PREGNANCY VISIT Patient name: Mallory Atkins MRN [id number]  Date of birth: 12/10/1990 Chief Complaint:   Routine Prenatal Visit  History of Present Illness:   Mallory Atkins is a 30 y.o. G1P0000 female at [redacted]w[redacted]d with an Estimated Date of Delivery: 01/24/21 being seen today for ongoing management of a low-risk pregnancy.  Depression screen [facility] 2/9 10/30/2020 08/03/2020 05/17/2020  Decreased Interest 0 0 0  Down, Depressed, Hopeless 0 0 0  PHQ - 2 Score 0 0 0  Altered sleeping 0 0 0  Tired, decreased energy 1 1 0  Change in appetite 0 0 0  Feeling bad or failure about yourself  0 0 0  Trouble concentrating 0 0 0  Moving slowly or fidgety/restless 0 0 0  Suicidal thoughts 0 0 0  PHQ-9 Score 1 1 0    Today she reports no complaints. Went to [employer] class. Contractions: Not present. Vag. Bleeding: None.  Movement: Present. denies leaking of fluid. Review of Systems:   Pertinent items are noted in HPI Denies abnormal vaginal discharge w/ itching/odor/irritation, headaches, visual changes, shortness of breath, chest pain, abdominal pain, severe nausea/vomiting, or problems with urination or bowel movements unless otherwise stated above. Pertinent History Reviewed:  Reviewed past medical,surgical, social, obstetrical and family history.  Reviewed problem list, medications and allergies. Physical Assessment:   Vitals:   11/19/20 1352  BP: 132/85  Pulse: 98  Weight: (!) 340 lb 6.4 oz (154.4 kg)  Body mass index is 62.26 kg/m.        Physical Examination:   General appearance: Well appearing, and in no distress  Mental status: Alert, oriented to person, place, and time  Skin: Warm & dry  Cardiovascular: Normal heart rate noted  Respiratory: Normal respiratory effort, no distress  Abdomen: Soft, gravid, nontender  Pelvic: Cervical exam deferred         Extremities: Edema: Trace  Fetal Status: Fetal Heart Rate (bpm): 145 Fundal Height: 31 cm Movement: Present    Chaperone: N/A    No results found for this or any previous visit (from the past 24 hour(s)).  Assessment & Plan:  1) Low-risk pregnancy G1P0000 at [redacted]w[redacted]d with an Estimated Date of Delivery: 01/24/21   2) Interested in Systems developer, has attended class. To send pic of certificate via mychart   Meds: No orders of the defined types were placed in this encounter.  Labs/procedures today: none  Plan:  Continue routine obstetrical care  Next visit: prefers in person    Reviewed: Preterm labor symptoms and general obstetric precautions including but not limited to vaginal bleeding, contractions, leaking of fluid and fetal movement were reviewed in detail with the patient.  All questions were answered. Does have home bp cuff. Office bp cuff given: not applicable. Check bp weekly, let us know if consistently >140 and/or >90.  Follow-up: Return in about 2 weeks (around 12/03/2020) for LROB, CNM, in person.  No future appointments.  No orders of the defined types were placed in this encounter.  Cheral Marker CNM, [facility] 11/19/2020 2:19 PM

## 2020-11-19 NOTE — Patient Instructions (Signed)
Mallory Atkins, I greatly value your feedback.  If you receive a survey following your visit with [place] today, we appreciate you taking the time to fill it out.  Thanks, Mallory Atkins, CNM, WHNP-BC   [facility] at [facility] ([address] [place], [place] [address]) Entrance C, located off of [address] valet parking  Go to [email or web address] to register for FREE online childbirth classes   Call the office [phone]) or go to [facility] if:  You begin to have strong, frequent contractions  Your water breaks.  Sometimes it is a big gush of fluid, sometimes it is just a trickle that keeps getting your panties wet or running down your legs  You have vaginal bleeding.  It is normal to have a small amount of spotting if your cervix was checked.   You don't feel your baby moving like normal.  If you don't, get you something to eat and drink and lay down and focus on feeling your baby move.  You should feel at least 10 movements in 2 hours.  If you don't, you should call the office or go to [facility].    Tdap Vaccine  It is recommended that you get the Tdap vaccine during the third trimester of EACH pregnancy to help protect your baby from getting pertussis (whooping cough)  27-36 weeks is the BEST time to do this so that you can pass the protection on to your baby. During pregnancy is better than after pregnancy, but if you are unable to get it during pregnancy it will be offered at the hospital.   You can get this vaccine with [place], at the health department, your family doctor, or some local pharmacies  Everyone who will be around your baby should also be up-to-date on their vaccines before the baby comes. Adults (who are not pregnant) only need 1 dose of Tdap during adulthood.   [place] Pediatricians/Family Doctors:  [facility] [phone]            [facility] [phone]                 [facility]  [phone] (usually not accepting new patients unless you have family there already, you are always welcome to call and ask)       [facility] Department [phone]       [facility] Pediatricians/Family Doctors:   [facility]: [phone]  [facility]: [phone]  [facility]: [phone]  [facility] Doctors:   [facility]: [phone]   [facility]: [phone]  [facility] Doctors:  [facility]: [phone]   Home Blood Pressure Monitoring for Patients   Your provider has recommended that you check your blood pressure (BP) at least once a week at home. If you do not have a blood pressure cuff at home, one will be provided for you. Contact your provider if you have not received your monitor within 1 week.   Helpful Tips for Accurate Home Blood Pressure Checks  . Don't smoke, exercise, or drink caffeine 30 minutes before checking your BP . Use the restroom before checking your BP (a full bladder can raise your pressure) . Relax in a comfortable upright chair . Feet on the ground . Left arm resting comfortably on a flat surface at the level of your heart . Legs uncrossed . Back supported . Sit quietly and don't talk . Place the cuff on your bare  arm . Adjust snuggly, so that only two fingertips can fit between your skin and the top of the cuff . Check 2 readings separated by at least one minute . Keep a log of your BP readings . For a visual, please reference this diagram: http://ccnc.care/bpdiagram  Provider Name: [facility]     Phone: [phone]  Zone 1: ALL CLEAR  Continue to monitor your symptoms:  . BP reading is less than 140 (top number) or less than 90 (bottom number)  . No right upper stomach pain . No headaches or seeing spots . No feeling nauseated or throwing up . No swelling in face and hands  Zone 2: CAUTION Call your  doctor's office for any of the following:  . BP reading is greater than 140 (top number) or greater than 90 (bottom number)  . Stomach pain under your ribs in the middle or right side . Headaches or seeing spots . Feeling nauseated or throwing up . Swelling in face and hands  Zone 3: EMERGENCY  Seek immediate medical care if you have any of the following:  . BP reading is greater than160 (top number) or greater than 110 (bottom number) . Severe headaches not improving with Tylenol . Serious difficulty catching your breath . Any worsening symptoms from Zone 2   Third Trimester of Pregnancy The third trimester is from week 29 through week 42, months 7 through 9. The third trimester is a time when the fetus is growing rapidly. At the end of the ninth month, the fetus is about 20 inches in length and weighs 6-10 pounds.  BODY CHANGES Your body goes through many changes during pregnancy. The changes vary from woman to woman.   Your weight will continue to increase. You can expect to gain 25-35 pounds (11-16 kg) by the end of the pregnancy.  You may begin to get stretch marks on your hips, abdomen, and breasts.  You may urinate more often because the fetus is moving lower into your pelvis and pressing on your bladder.  You may develop or continue to have heartburn as a result of your pregnancy.  You may develop constipation because certain hormones are causing the muscles that push waste through your intestines to slow down.  You may develop hemorrhoids or swollen, bulging veins (varicose veins).  You may have pelvic pain because of the weight gain and pregnancy hormones relaxing your joints between the bones in your pelvis. Backaches may result from overexertion of the muscles supporting your posture.  You may have changes in your hair. These can include thickening of your hair, rapid growth, and changes in texture. Some women also have hair loss during or after pregnancy, or hair that  feels dry or thin. Your hair will most likely return to normal after your baby is born.  Your breasts will continue to grow and be tender. A yellow discharge may leak from your breasts called colostrum.  Your belly button may stick out.  You may feel short of breath because of your expanding uterus.  You may notice the fetus "dropping," or moving lower in your abdomen.  You may have a bloody mucus discharge. This usually occurs a few days to a week before labor begins.  Your cervix becomes thin and soft (effaced) near your due date. WHAT TO EXPECT AT YOUR PRENATAL EXAMS  You will have prenatal exams every 2 weeks until week 36. Then, you will have weekly prenatal exams. During a routine prenatal visit:  You  will be weighed to make sure you and the fetus are growing normally.  Your blood pressure is taken.  Your abdomen will be measured to track your baby's growth.  The fetal heartbeat will be listened to.  Any test results from the previous visit will be discussed.  You may have a cervical check near your due date to see if you have effaced. At around 36 weeks, your caregiver will check your cervix. At the same time, your caregiver will also perform a test on the secretions of the vaginal tissue. This test is to determine if a type of bacteria, Group B streptococcus, is present. Your caregiver will explain this further. Your caregiver may ask you:  What your birth plan is.  How you are feeling.  If you are feeling the baby move.  If you have had any abnormal symptoms, such as leaking fluid, bleeding, severe headaches, or abdominal cramping.  If you have any questions. Other tests or screenings that may be performed during your third trimester include:  Blood tests that check for low iron levels (anemia).  Fetal testing to check the health, activity level, and growth of the fetus. Testing is done if you have certain medical conditions or if there are problems during the  pregnancy. FALSE LABOR You may feel small, irregular contractions that eventually go away. These are called Braxton Hicks contractions, or false labor. Contractions may last for hours, days, or even weeks before true labor sets in. If contractions come at regular intervals, intensify, or become painful, it is best to be seen by your caregiver.  SIGNS OF LABOR   Menstrual-like cramps.  Contractions that are 5 minutes apart or less.  Contractions that start on the top of the uterus and spread down to the lower abdomen and back.  A sense of increased pelvic pressure or back pain.  A watery or bloody mucus discharge that comes from the vagina. If you have any of these signs before the 37th week of pregnancy, call your caregiver right away. You need to go to the hospital to get checked immediately. HOME CARE INSTRUCTIONS   Avoid all smoking, herbs, alcohol, and unprescribed drugs. These chemicals affect the formation and growth of the baby.  Follow your caregiver's instructions regarding medicine use. There are medicines that are either safe or unsafe to take during pregnancy.  Exercise only as directed by your caregiver. Experiencing uterine cramps is a good sign to stop exercising.  Continue to eat regular, healthy meals.  Wear a good support bra for breast tenderness.  Do not use hot tubs, steam rooms, or saunas.  Wear your seat belt at all times when driving.  Avoid raw meat, uncooked cheese, cat litter boxes, and soil used by cats. These carry germs that can cause birth defects in the baby.  Take your prenatal vitamins.  Try taking a stool softener (if your caregiver approves) if you develop constipation. Eat more high-fiber foods, such as fresh vegetables or fruit and whole grains. Drink plenty of fluids to keep your urine clear or pale yellow.  Take warm sitz baths to soothe any pain or discomfort caused by hemorrhoids. Use hemorrhoid cream if your caregiver approves.  If you  develop varicose veins, wear support hose. Elevate your feet for 15 minutes, 3-4 times a day. Limit salt in your diet.  Avoid heavy lifting, wear low heal shoes, and practice good posture.  Rest a lot with your legs elevated if you have leg cramps or low back  pain.  Visit your dentist if you have not gone during your pregnancy. Use a soft toothbrush to brush your teeth and be gentle when you floss.  A sexual relationship may be continued unless your caregiver directs you otherwise.  Do not travel far distances unless it is absolutely necessary and only with the approval of your caregiver.  Take prenatal classes to understand, practice, and ask questions about the labor and delivery.  Make a trial run to the hospital.  Pack your hospital bag.  Prepare the baby's nursery.  Continue to go to all your prenatal visits as directed by your caregiver. SEEK MEDICAL CARE IF:  You are unsure if you are in labor or if your water has broken.  You have dizziness.  You have mild pelvic cramps, pelvic pressure, or nagging pain in your abdominal area.  You have persistent nausea, vomiting, or diarrhea.  You have a bad smelling vaginal discharge.  You have pain with urination. SEEK IMMEDIATE MEDICAL CARE IF:   You have a fever.  You are leaking fluid from your vagina.  You have spotting or bleeding from your vagina.  You have severe abdominal cramping or pain.  You have rapid weight loss or gain.  You have shortness of breath with chest pain.  You notice sudden or extreme swelling of your face, hands, ankles, feet, or legs.  You have not felt your baby move in over an hour.  You have severe headaches that do not go away with medicine.  You have vision changes. Document Released: 06/17/2001 Document Revised: 06/28/2013 Document Reviewed: 08/24/2012 [facility] Patient Information 2015 [place], [place]. This information is not intended to replace advice given to you by your health  care provider. Make sure you discuss any questions you have with your health care provider.   Considering Waterbirth? Guide for patients at Center for [facility]) Why consider waterbirth? . Gentle birth for babies  . Less pain medicine used in labor  . May allow for passive descent/less pushing  . May reduce perineal tears  . More mobility and instinctive maternal position changes  . Increased maternal relaxation   Is waterbirth safe? What are the risks of infection, drowning or other complications? . Infection:  Marland Kitchen Very low risk (3.7 % for tub vs 4.8% for bed)  . 7 in 8000 waterbirths with documented infection  . Poorly cleaned equipment most common cause  . Slightly lower group B strep transmission rate  . Drowning  . Maternal:  . Very low risk  . Related to seizures or fainting  . Newborn:  Marland Kitchen Very low risk. No evidence of increased risk of respiratory problems in multiple large studies  . Physiological protection from breathing under water  . Avoid underwater birth if there are any fetal complications  . Once baby's head is out of the water, keep it out.  . Birth complication  . Some reports of cord trauma, but risk decreased by bringing baby to surface gradually  . No evidence of increased risk of shoulder dystocia. Mothers can usually change positions faster in water than in a bed, possibly aiding the maneuvers to free the shoulder.   There are 2 things you MUST do to have a waterbirth with [facility]: 1. Attend a waterbirth class at [facility] at [facility]   a. 3rd Wednesday of every month from 7-9 pm (virtual during COVID) b. Free c. Register online at [email or web address] or [email or web address] or by calling [phone] d. Bring [place] the  certificate from the class to your prenatal appointment or send via MyChart 2. Meet with a midwife at 36 weeks* to see if you can still plan a waterbirth and to sign the consent.   *We also recommend  that you schedule as many of your prenatal visits with a midwife as possible.    Helpful information: . You may want to bring a bathing suit top to the hospital to wear during labor but this is optional.  All other supplies are provided by the hospital. . Please arrive at the hospital with signs of active labor, and do not wait at home until late in labor. It takes 45 min- 2 hours for COVID testing, fetal monitoring, and check in to your room to take place, plus transport and filling of the waterbirth tub.    Things that would prevent you from having a waterbirth: . Unknown or Positive COVID-19 diagnosis upon admission to hospital* . Premature, <37wks  . Previous cesarean birth  . Presence of thick meconium-stained fluid  . Multiple gestation (Twins, triplets, etc.)  . Uncontrolled diabetes or gestational diabetes requiring medication  . Hypertension diagnosed in pregnancy or preexisting hypertension (gestational hypertension, preeclampsia, or chronic hypertension) . Fetal growth restriction (your baby measures less than 10th percentile on ultrasound) . Heavy vaginal bleeding  . Non-reassuring fetal heart rate  . Active infection (MRSA, etc.). Group B Strep is NOT a contraindication for waterbirth.  . If your labor has to be induced and induction method requires continuous monitoring of the baby's heart rate  . Other risks/issues identified by your obstetrical provider   Please remember that birth is unpredictable. Under certain unforeseeable circumstances your provider may advise against giving birth in the tub. These decisions will be made on a case-by-case basis and with the safety of you and your baby as our highest priority.   *Please remember that in order to have a waterbirth, you must test Negative to COVID-19 upon admission to the hospital.  Updated 10/15/20

## 2020-11-20 ENCOUNTER — Encounter: Payer: BC Managed Care – PPO | Admitting: Women's Health

## 2020-11-22 DIAGNOSIS — M5489 Other dorsalgia: Secondary | ICD-10-CM | POA: Diagnosis not present

## 2020-12-04 ENCOUNTER — Other Ambulatory Visit (INDEPENDENT_AMBULATORY_CARE_PROVIDER_SITE_OTHER): Payer: BC Managed Care – PPO

## 2020-12-04 ENCOUNTER — Other Ambulatory Visit: Payer: Self-pay

## 2020-12-04 ENCOUNTER — Ambulatory Visit (INDEPENDENT_AMBULATORY_CARE_PROVIDER_SITE_OTHER): Payer: BC Managed Care – PPO | Admitting: Women's Health

## 2020-12-04 ENCOUNTER — Encounter: Payer: Self-pay | Admitting: Women's Health

## 2020-12-04 VITALS — BP 125/83 | HR 106 | Wt 344.0 lb

## 2020-12-04 DIAGNOSIS — O10919 Unspecified pre-existing hypertension complicating pregnancy, unspecified trimester: Secondary | ICD-10-CM

## 2020-12-04 DIAGNOSIS — Z3403 Encounter for supervision of normal first pregnancy, third trimester: Secondary | ICD-10-CM

## 2020-12-04 DIAGNOSIS — L732 Hidradenitis suppurativa: Secondary | ICD-10-CM | POA: Diagnosis not present

## 2020-12-04 DIAGNOSIS — I10 Essential (primary) hypertension: Secondary | ICD-10-CM | POA: Insufficient documentation

## 2020-12-04 DIAGNOSIS — Z79899 Other long term (current) drug therapy: Secondary | ICD-10-CM | POA: Diagnosis not present

## 2020-12-04 DIAGNOSIS — O0993 Supervision of high risk pregnancy, unspecified, third trimester: Secondary | ICD-10-CM

## 2020-12-04 DIAGNOSIS — O99210 Obesity complicating pregnancy, unspecified trimester: Secondary | ICD-10-CM

## 2020-12-04 DIAGNOSIS — B001 Herpesviral vesicular dermatitis: Secondary | ICD-10-CM | POA: Insufficient documentation

## 2020-12-04 DIAGNOSIS — Z3A32 32 weeks gestation of pregnancy: Secondary | ICD-10-CM | POA: Diagnosis not present

## 2020-12-04 NOTE — Progress Notes (Signed)
[place] 32+5 wks,cephalic,fhr 137 bpm,anterior placenta gr 3,afi 13 cm,RI .65,.62,.66,.60=62%,BPP 8/8,EFW 2303 g 76%,AC 95%

## 2020-12-04 NOTE — Progress Notes (Signed)
HIGH-RISK PREGNANCY VISIT Patient name: Mallory Atkins MRN [id number]  Date of birth: [birth year] Chief Complaint:   Routine Prenatal Visit  History of Present Illness:   Mallory Atkins is a 30 y.o. G8P0000 female at [redacted]w[redacted]d with an Estimated Date of Delivery: 01/24/21 being seen today for ongoing management of a high-risk pregnancy complicated by chronic hypertension currently on no meds officially dx today based on chart review: 05/17/21 [facility] visit (not visible on OB VS tab, so didn't see until today) 149/95, 15wks: 133/93, 28wks: 144/98. Denies ever being dx w/ HTN, bp's have been high at PCP but are normal when they recheck w/ manual cuff. Does have family h/o HTN.   Today she reports went to [place] for 3D u/s and got good pics!. Contractions: Not present. Vag. Bleeding: None.  Movement: Present. denies leaking of fluid.   Depression screen [facility] 2/9 10/30/2020 08/03/2020 05/17/2020  Decreased Interest 0 0 0  Down, Depressed, Hopeless 0 0 0  PHQ - 2 Score 0 0 0  Altered sleeping 0 0 0  Tired, decreased energy 1 1 0  Change in appetite 0 0 0  Feeling bad or failure about yourself  0 0 0  Trouble concentrating 0 0 0  Moving slowly or fidgety/restless 0 0 0  Suicidal thoughts 0 0 0  PHQ-9 Score 1 1 0     GAD 7 : Generalized Anxiety Score 10/30/2020 08/03/2020 05/17/2020  Nervous, Anxious, on Edge 0 0 0  Control/stop worrying 0 0 0  Worry too much - different things 0 0 0  Trouble relaxing 0 0 0  Restless 0 0 0  Easily annoyed or irritable 0 0 0  Afraid - awful might happen 0 0 0  Total GAD 7 Score 0 0 0     Review of Systems:   Pertinent items are noted in HPI Denies abnormal vaginal discharge w/ itching/odor/irritation, headaches, visual changes, shortness of breath, chest pain, abdominal pain, severe nausea/vomiting, or problems with urination or bowel movements unless otherwise stated above. Pertinent History Reviewed:  Reviewed past medical,surgical, social, obstetrical and family  history.  Reviewed problem list, medications and allergies. Physical Assessment:   Vitals:   12/04/20 1003  BP: 125/83  Pulse: (!) 106  Weight: (!) 344 lb (156 kg)  Body mass index is 62.92 kg/m.           Physical Examination:   General appearance: alert, well appearing, and in no distress  Mental status: alert, oriented to person, place, and time  Skin: warm & dry   Extremities: Edema: Trace    Cardiovascular: normal heart rate noted  Respiratory: normal respiratory effort, no distress  Abdomen: gravid, soft, non-tender  Pelvic: Cervical exam deferred         Fetal Status: Fetal Heart Rate (bpm): 137 u/s   Movement: Present    Fetal Surveillance Testing today:  [place] 32+5 wks,cephalic,fhr 137 bpm,anterior placenta gr 3,afi 13 cm,RI .65,.62,.66,.60=62%,BPP 8/8,EFW 2303 g 76%,AC 95%  Chaperone: N/A    No results found for this or any previous visit (from the past 24 hour(s)).  Assessment & Plan:  High-risk pregnancy: G1P0000 at [redacted]w[redacted]d with an Estimated Date of Delivery: 01/24/21   1) CHTN, stable, officially dx today based on chart review (elevated bp's Nov MAU visit, 15wks and 28wks), no meds, continue ASA. Normal bp today.   2) Interested in Systems developer, discussed unfortunately HTN rules her out of waterbirth. Is afraid of getting 'stuck in bed' and not being able  to move around. Discussed she still can be mobile during labor (wireless monitors, etc) as long as she doesn't get epidural.   3) Excessive weight gain> 54lbs to date, BMI currently 62, recommended no further weight gain, try to improve eating habits, and increase activity  4) HS> on infliximab infusions q 6wks at [facility], well controlled  Meds: No orders of the defined types were placed in this encounter.  Labs/procedures today: U/S  Treatment Plan:   Growth u/s q 4wks    2x/wk testing nst/sono    Deliver 39wks   Reviewed: Preterm labor symptoms and general obstetric precautions including but not limited to vaginal  bleeding, contractions, leaking of fluid and fetal movement were reviewed in detail with the patient.  All questions were answered.   Follow-up: Return for today for u/s, then frid nst/nurse; then weekly bpp/dopp on tues w/ hrob and nst/nurse on fridays .   Future Appointments  Date Time Provider Department Center  12/07/2020 12:10 PM [facility] NURSE [facility]  12/14/2020 11:50 AM [facility] NURSE [facility]  12/21/2020 11:50 AM [facility]  12/28/2020 11:50 AM [facility]UR[facility]  01/04/2021 11:50 AM [facility]  01/11/2021  9:30 AM [facility] NURSE [facility]  01/18/2021 11:50 AM [facility] NURSE [facility]    Orders Placed This Encounter  Procedures  . US OB Follow Up  . US FETAL BPP WO NON STRESS  . [place] UA Cord Doppler   Cheral Marker CNM, [facility] 12/04/2020 2:56 PM

## 2020-12-04 NOTE — Patient Instructions (Addendum)
Mallory Atkins, thank you for choosing our office today! We appreciate the opportunity to meet your healthcare needs. You may receive a short survey by mail, e-mail, or through [employer]. If you are happy with your care we would appreciate if you could take just a few minutes to complete the survey questions. We read all of your comments and take your feedback very seriously. Thank you again for choosing our office.  Center for [employer] Team at [facility]  [facility] at [facility] ([address] [place], [place] [address]) Entrance C, located off of [address] Free 24/7 valet parking   CLASSES: Go to [email or web address] to register for classes (childbirth, breastfeeding, waterbirth, infant CPR, daddy bootcamp, etc.)  Call the office [phone]) or go to [facility] if:  You begin to have strong, frequent contractions  Your water breaks.  Sometimes it is a big gush of fluid, sometimes it is just a trickle that keeps getting your panties wet or running down your legs  You have vaginal bleeding.  It is normal to have a small amount of spotting if your cervix was checked.   You don't feel your baby moving like normal.  If you don't, get you something to eat and drink and lay down and focus on feeling your baby move.   If your baby is still not moving like normal, you should call the office or go to [facility].  Call the office [phone]) or go to [facility] hospital for these signs of pre-eclampsia:  Severe headache that does not go away with Tylenol  Visual changes- seeing spots, double, blurred vision  Pain under your right breast or upper abdomen that does not go away with Tums or heartburn medicine  Nausea and/or vomiting  Severe swelling in your hands, feet, and face   Tdap Vaccine  It is recommended that you get the Tdap vaccine during the third trimester of EACH pregnancy to help protect your baby from getting pertussis (whooping cough)  27-36 weeks is  the BEST time to do this so that you can pass the protection on to your baby. During pregnancy is better than after pregnancy, but if you are unable to get it during pregnancy it will be offered at the hospital.   You can get this vaccine with [place], at the health department, your family doctor, or some local pharmacies  Everyone who will be around your baby should also be up-to-date on their vaccines before the baby comes. Adults (who are not pregnant) only need 1 dose of Tdap during adulthood.   [facility] Pediatricians/Family Doctors  [facility]): [address] Colette Ribas, [phone]            [facility]: [address], [phone]                 [facility]): [address], [phone] (call to ask if accepting patients)  [facility] Department: [address], [place], [phone]    [facility] Pediatricians/Family Doctors  [facility]): [phone] [address], [phone]  [facility]: [address] [place], [phone]  [facility]: [address], [phone]  [facility] Doctors   [facility]): [phone]  [facility]: [address], [phone]   [facility] Doctors  [facility]: [address] [phone]  [facility] Doctors  . Manson Passey  [facility]: [address], [phone]  Home Blood Pressure Monitoring for Patients   Your provider has recommended that you check your blood pressure (BP) at least once a week at home. If you do not have a blood pressure cuff at home, one will be provided for you. Contact your provider if you have not received your monitor within 1 week.   Helpful Tips for Accurate Home Blood Pressure Checks  . Don't smoke, exercise, or drink caffeine 30 minutes before checking your BP . Use the restroom before  checking your BP (a full bladder can raise your pressure) . Relax in a comfortable upright chair . Feet on the ground . Left arm resting comfortably on a flat surface at the level of your heart . Legs uncrossed . Back supported . Sit quietly and don't talk . Place the cuff on your bare arm . Adjust snuggly, so that only two fingertips can fit between your skin and the top of the cuff . Check 2 readings separated by at least one minute . Keep a log of your BP readings . For a visual, please reference this diagram: http://ccnc.care/bpdiagram  Provider Name: [facility]     Phone: [phone]  Zone 1: ALL CLEAR  Continue to monitor your symptoms:  . BP reading is less than 140 (top number) or less than 90 (bottom number)  . No right upper stomach pain . No headaches or seeing spots . No feeling nauseated or throwing up . No swelling in face and hands  Zone 2: CAUTION Call your doctor's office for any of the following:  . BP reading is greater than 140 (top number) or greater than 90 (bottom number)  . Stomach pain under your ribs in the middle or right side . Headaches or seeing spots . Feeling nauseated or throwing up . Swelling in face and hands  Zone 3: EMERGENCY  Seek immediate medical care if you have any of the following:  . BP reading is greater than160 (top number) or greater than 110 (bottom number) . Severe headaches not improving with Tylenol . Serious difficulty catching your breath . Any worsening symptoms from Zone 2  Preterm Labor and Birth Information  The normal length of a pregnancy is 39-41 weeks. Preterm labor is when labor starts before 37 completed weeks of pregnancy. What are the risk factors for preterm labor? Preterm labor is more likely to occur in women who:  Have certain infections during pregnancy such as a bladder infection, sexually transmitted infection, or infection inside the uterus (chorioamnionitis).  Have a shorter-than-normal  cervix.  Have gone into preterm labor before.  Have had surgery on their cervix.  Are younger than age 61 or older than age 74.  Are African American.  Are pregnant with twins or multiple babies (multiple gestation).  Take street drugs or smoke while pregnant.  Do not gain enough weight while pregnant.  Became pregnant shortly after having been pregnant. What are the symptoms of preterm labor? Symptoms of preterm labor include:  Cramps similar to those that can happen during a menstrual period. The cramps may happen with diarrhea.  Pain in the abdomen or lower back.  Regular uterine contractions that may feel like tightening of the abdomen.  A feeling of increased pressure in the pelvis.  Increased watery or bloody mucus discharge from the vagina.  Water breaking (ruptured amniotic sac). Why is it important to recognize signs of preterm labor? It is important to recognize signs of  preterm labor because babies who are born prematurely may not be fully developed. This can put them at an increased risk for:  Long-term (chronic) heart and lung problems.  Difficulty immediately after birth with regulating body systems, including blood sugar, body temperature, heart rate, and breathing rate.  Bleeding in the brain.  Cerebral palsy.  Learning difficulties.  Death. These risks are highest for babies who are born before 34 weeks of pregnancy. How is preterm labor treated? Treatment depends on the length of your pregnancy, your condition, and the health of your baby. It may involve: 1. Having a stitch (suture) placed in your cervix to prevent your cervix from opening too early (cerclage). 2. Taking or being given medicines, such as: ? Hormone medicines. These may be given early in pregnancy to help support the pregnancy. ? Medicine to stop contractions. ? Medicines to help mature the baby's lungs. These may be prescribed if the risk of delivery is high. ? Medicines to  prevent your baby from developing cerebral palsy. If the labor happens before 34 weeks of pregnancy, you may need to stay in the hospital. What should I do if I think I am in preterm labor? If you think that you are going into preterm labor, call your health care provider right away. How can I prevent preterm labor in future pregnancies? To increase your chance of having a full-term pregnancy:  Do not use any tobacco products, such as cigarettes, chewing tobacco, and e-cigarettes. If you need help quitting, ask your health care provider.  Do not use street drugs or medicines that have not been prescribed to you during your pregnancy.  Talk with your health care provider before taking any herbal supplements, even if you have been taking them regularly.  Make sure you gain a healthy amount of weight during your pregnancy.  Watch for infection. If you think that you might have an infection, get it checked right away.  Make sure to tell your health care provider if you have gone into preterm labor before. This information is not intended to replace advice given to you by your health care provider. Make sure you discuss any questions you have with your health care provider. Document Revised: 10/15/2018 Document Reviewed: 11/14/2015 Elsevier Patient Education  2020 Elsevier Inc.    

## 2020-12-07 ENCOUNTER — Other Ambulatory Visit: Payer: Self-pay

## 2020-12-07 ENCOUNTER — Ambulatory Visit (INDEPENDENT_AMBULATORY_CARE_PROVIDER_SITE_OTHER): Payer: BC Managed Care – PPO | Admitting: *Deleted

## 2020-12-07 DIAGNOSIS — O0990 Supervision of high risk pregnancy, unspecified, unspecified trimester: Secondary | ICD-10-CM

## 2020-12-07 DIAGNOSIS — O99210 Obesity complicating pregnancy, unspecified trimester: Secondary | ICD-10-CM

## 2020-12-07 DIAGNOSIS — G43809 Other migraine, not intractable, without status migrainosus: Secondary | ICD-10-CM

## 2020-12-07 DIAGNOSIS — O10919 Unspecified pre-existing hypertension complicating pregnancy, unspecified trimester: Secondary | ICD-10-CM

## 2020-12-07 NOTE — Progress Notes (Addendum)
   NURSE VISIT- NST  SUBJECTIVE:  Mallory Atkins is a 30 y.o. G1P0000 female at [redacted]w[redacted]d, here for a NST for pregnancy complicated by [facility].  She reports active fetal movement, contractions: none, vaginal bleeding: none, membranes: intact.   OBJECTIVE:  BP 139/86   Pulse 87   Wt (!) 348 lb (157.9 kg)   LMP 04/19/2020 (Exact Date)   BMI 63.65 kg/m   Appears well, no apparent distress  No results found for this or any previous visit (from the past 24 hour(s)).  NST: FHR baseline 135 bpm, Variability: moderate, Accelerations:present, Decelerations:  Absent= Cat 1 /reactive Toco: none   ASSESSMENT: G1P0000 at [redacted]w[redacted]d with CHTN NST reactive  PLAN: EFM strip reviewed by Dr. Despina Hidden   Recommendations: keep next appointment as scheduled    Jobe Marker  12/07/2020 1:21 PM  Attestation of Attending Supervision of Advanced Practitioner (CNM/NP/PA): Evaluation and management procedures were performed by the Advanced Practitioner under my supervision and collaboration.  I have reviewed the Advanced Practitioner's note and chart, and I agree with the management and plan.  Rockne Coons MD Attending Physician for the Center for [facility] 12/13/2020 8:27 AM

## 2020-12-10 ENCOUNTER — Other Ambulatory Visit: Payer: Self-pay

## 2020-12-10 ENCOUNTER — Encounter: Payer: Self-pay | Admitting: Women's Health

## 2020-12-10 ENCOUNTER — Ambulatory Visit (INDEPENDENT_AMBULATORY_CARE_PROVIDER_SITE_OTHER): Payer: BC Managed Care – PPO

## 2020-12-10 ENCOUNTER — Ambulatory Visit (INDEPENDENT_AMBULATORY_CARE_PROVIDER_SITE_OTHER): Payer: BC Managed Care – PPO | Admitting: Women's Health

## 2020-12-10 VITALS — BP 134/84 | HR 90 | Wt 349.8 lb

## 2020-12-10 DIAGNOSIS — O0993 Supervision of high risk pregnancy, unspecified, third trimester: Secondary | ICD-10-CM

## 2020-12-10 DIAGNOSIS — O0990 Supervision of high risk pregnancy, unspecified, unspecified trimester: Secondary | ICD-10-CM

## 2020-12-10 DIAGNOSIS — Z3A33 33 weeks gestation of pregnancy: Secondary | ICD-10-CM | POA: Diagnosis not present

## 2020-12-10 DIAGNOSIS — O10919 Unspecified pre-existing hypertension complicating pregnancy, unspecified trimester: Secondary | ICD-10-CM

## 2020-12-10 DIAGNOSIS — O99210 Obesity complicating pregnancy, unspecified trimester: Secondary | ICD-10-CM

## 2020-12-10 LAB — POCT URINALYSIS DIPSTICK OB
Blood, UA: NEGATIVE
Glucose, UA: NEGATIVE
Ketones, UA: NEGATIVE
Leukocytes, UA: NEGATIVE
Nitrite, UA: NEGATIVE
POC,PROTEIN,UA: NEGATIVE

## 2020-12-10 NOTE — Progress Notes (Signed)
[place] 33+4 wks,cephalic,BPP 8/8,FHR 150 BPM,anterior placenta gr 3,afi 15.9 cm,RI .63,.61,.67,.65=71%,EFW 2563 g 82%,AC 95%

## 2020-12-10 NOTE — Patient Instructions (Signed)
Tinamarie, thank you for choosing our office today! We appreciate the opportunity to meet your healthcare needs. You may receive a short survey by mail, e-mail, or through [employer]. If you are happy with your care we would appreciate if you could take just a few minutes to complete the survey questions. We read all of your comments and take your feedback very seriously. Thank you again for choosing our office.  Center for [employer] Team at [facility]  [facility] ([address] [place], [place] [address]) Entrance C, located off of [address] Free 24/7 valet parking   CLASSES: Go to [email or web address] to register for classes (childbirth, breastfeeding, waterbirth, infant CPR, daddy bootcamp, etc.)  Call the office [phone]) or go to [facility] if:  You begin to have strong, frequent contractions  Your water breaks.  Sometimes it is a big gush of fluid, sometimes it is just a trickle that keeps getting your panties wet or running down your legs  You have vaginal bleeding.  It is normal to have a small amount of spotting if your cervix was checked.   You don't feel your baby moving like normal.  If you don't, get you something to eat and drink and lay down and focus on feeling your baby move.   If your baby is still not moving like normal, you should call the office or go to [facility].  Call the office [phone]) or go to [facility] hospital for these signs of pre-eclampsia:  Severe headache that does not go away with Tylenol  Visual changes- seeing spots, double, blurred vision  Pain under your right breast or upper abdomen that does not go away with Tums or heartburn medicine  Nausea and/or vomiting  Severe swelling in your hands, feet, and face   Tdap Vaccine  It is recommended that you get the Tdap vaccine during the third trimester of EACH pregnancy to help protect your baby from getting pertussis (whooping cough)  27-36 weeks is  the BEST time to do this so that you can pass the protection on to your baby. During pregnancy is better than after pregnancy, but if you are unable to get it during pregnancy it will be offered at the hospital.   You can get this vaccine with [place], at the health department, your family doctor, or some local pharmacies  Everyone who will be around your baby should also be up-to-date on their vaccines before the baby comes. Adults (who are not pregnant) only need 1 dose of Tdap during adulthood.   [facility] Pediatricians/Family Doctors  [facility]): [address] Colette Ribas, [phone]            [facility]: [address], [phone]                 [facility]): [address], [phone] (call to ask if accepting patients)  [facility] Department: [address], [place], [phone]    [facility] Pediatricians/Family Doctors  [facility]): [phone] [address], [phone]  [facility]: [address] [place], [phone]  [facility]: [address], [phone]  [facility] Doctors   [facility]): [phone]  [facility]: [address], [phone]   [facility] Doctors  [facility]: [address] [phone]  [facility] Doctors  . Manson Passey  [facility]: [address], [phone]  Home Blood Pressure Monitoring for Patients   Your provider has recommended that you check your blood pressure (BP) at least once a week at home. If you do not have a blood pressure cuff at home, one will be provided for you. Contact your provider if you have not received your monitor within 1 week.   Helpful Tips for Accurate Home Blood Pressure Checks  . Don't smoke, exercise, or drink caffeine 30 minutes before checking your BP . Use the restroom before  checking your BP (a full bladder can raise your pressure) . Relax in a comfortable upright chair . Feet on the ground . Left arm resting comfortably on a flat surface at the level of your heart . Legs uncrossed . Back supported . Sit quietly and don't talk . Place the cuff on your bare arm . Adjust snuggly, so that only two fingertips can fit between your skin and the top of the cuff . Check 2 readings separated by at least one minute . Keep a log of your BP readings . For a visual, please reference this diagram: http://ccnc.care/bpdiagram  Provider Name: [facility]     Phone: [phone]  Zone 1: ALL CLEAR  Continue to monitor your symptoms:  . BP reading is less than 140 (top number) or less than 90 (bottom number)  . No right upper stomach pain . No headaches or seeing spots . No feeling nauseated or throwing up . No swelling in face and hands  Zone 2: CAUTION Call your doctor's office for any of the following:  . BP reading is greater than 140 (top number) or greater than 90 (bottom number)  . Stomach pain under your ribs in the middle or right side . Headaches or seeing spots . Feeling nauseated or throwing up . Swelling in face and hands  Zone 3: EMERGENCY  Seek immediate medical care if you have any of the following:  . BP reading is greater than160 (top number) or greater than 110 (bottom number) . Severe headaches not improving with Tylenol . Serious difficulty catching your breath . Any worsening symptoms from Zone 2  Preterm Labor and Birth Information  The normal length of a pregnancy is 39-41 weeks. Preterm labor is when labor starts before 37 completed weeks of pregnancy. What are the risk factors for preterm labor? Preterm labor is more likely to occur in women who:  Have certain infections during pregnancy such as a bladder infection, sexually transmitted infection, or infection inside the uterus (chorioamnionitis).  Have a shorter-than-normal  cervix.  Have gone into preterm labor before.  Have had surgery on their cervix.  Are younger than age 61 or older than age 74.  Are African American.  Are pregnant with twins or multiple babies (multiple gestation).  Take street drugs or smoke while pregnant.  Do not gain enough weight while pregnant.  Became pregnant shortly after having been pregnant. What are the symptoms of preterm labor? Symptoms of preterm labor include:  Cramps similar to those that can happen during a menstrual period. The cramps may happen with diarrhea.  Pain in the abdomen or lower back.  Regular uterine contractions that may feel like tightening of the abdomen.  A feeling of increased pressure in the pelvis.  Increased watery or bloody mucus discharge from the vagina.  Water breaking (ruptured amniotic sac). Why is it important to recognize signs of preterm labor? It is important to recognize signs of  preterm labor because babies who are born prematurely may not be fully developed. This can put them at an increased risk for:  Long-term (chronic) heart and lung problems.  Difficulty immediately after birth with regulating body systems, including blood sugar, body temperature, heart rate, and breathing rate.  Bleeding in the brain.  Cerebral palsy.  Learning difficulties.  Death. These risks are highest for babies who are born before 34 weeks of pregnancy. How is preterm labor treated? Treatment depends on the length of your pregnancy, your condition, and the health of your baby. It may involve: 1. Having a stitch (suture) placed in your cervix to prevent your cervix from opening too early (cerclage). 2. Taking or being given medicines, such as: ? Hormone medicines. These may be given early in pregnancy to help support the pregnancy. ? Medicine to stop contractions. ? Medicines to help mature the baby's lungs. These may be prescribed if the risk of delivery is high. ? Medicines to  prevent your baby from developing cerebral palsy. If the labor happens before 34 weeks of pregnancy, you may need to stay in the hospital. What should I do if I think I am in preterm labor? If you think that you are going into preterm labor, call your health care provider right away. How can I prevent preterm labor in future pregnancies? To increase your chance of having a full-term pregnancy:  Do not use any tobacco products, such as cigarettes, chewing tobacco, and e-cigarettes. If you need help quitting, ask your health care provider.  Do not use street drugs or medicines that have not been prescribed to you during your pregnancy.  Talk with your health care provider before taking any herbal supplements, even if you have been taking them regularly.  Make sure you gain a healthy amount of weight during your pregnancy.  Watch for infection. If you think that you might have an infection, get it checked right away.  Make sure to tell your health care provider if you have gone into preterm labor before. This information is not intended to replace advice given to you by your health care provider. Make sure you discuss any questions you have with your health care provider. Document Revised: 10/15/2018 Document Reviewed: 11/14/2015 Elsevier Patient Education  2020 [employer].

## 2020-12-10 NOTE — Progress Notes (Signed)
HIGH-RISK PREGNANCY VISIT Patient name: Mallory Atkins MRN [id number]  Date of birth: 1991-06-27 Chief Complaint:   Routine Prenatal Visit (BPP)  History of Present Illness:   Mallory Atkins is a 30 y.o. G28P0000 female at [redacted]w[redacted]d with an Estimated Date of Delivery: 01/24/21 being seen today for ongoing management of a high-risk pregnancy complicated by chronic hypertension currently on no meds.    Today she reports decreased fetal movement the other day, picked up after she layed down. Contractions: Not present. Vag. Bleeding: None.  Movement: Present. denies leaking of fluid.   Depression screen [facility] 2/9 10/30/2020 08/03/2020 05/17/2020  Decreased Interest 0 0 0  Down, Depressed, Hopeless 0 0 0  PHQ - 2 Score 0 0 0  Altered sleeping 0 0 0  Tired, decreased energy 1 1 0  Change in appetite 0 0 0  Feeling bad or failure about yourself  0 0 0  Trouble concentrating 0 0 0  Moving slowly or fidgety/restless 0 0 0  Suicidal thoughts 0 0 0  PHQ-9 Score 1 1 0     GAD 7 : Generalized Anxiety Score 10/30/2020 08/03/2020 05/17/2020  Nervous, Anxious, on Edge 0 0 0  Control/stop worrying 0 0 0  Worry too much - different things 0 0 0  Trouble relaxing 0 0 0  Restless 0 0 0  Easily annoyed or irritable 0 0 0  Afraid - awful might happen 0 0 0  Total GAD 7 Score 0 0 0     Review of Systems:   Pertinent items are noted in HPI Denies abnormal vaginal discharge w/ itching/odor/irritation, headaches, visual changes, shortness of breath, chest pain, abdominal pain, severe nausea/vomiting, or problems with urination or bowel movements unless otherwise stated above. Pertinent History Reviewed:  Reviewed past medical,surgical, social, obstetrical and family history.  Reviewed problem list, medications and allergies. Physical Assessment:   Vitals:   12/10/20 1206  BP: 134/84  Pulse: 90  Weight: (!) 349 lb 12.8 oz (158.7 kg)  Body mass index is 63.98 kg/m.           Physical Examination:    General appearance: alert, well appearing, and in no distress  Mental status: alert, oriented to person, place, and time  Skin: warm & dry   Extremities: Edema: None    Cardiovascular: normal heart rate noted  Respiratory: normal respiratory effort, no distress  Abdomen: gravid, soft, non-tender  Pelvic: Cervical exam deferred         Fetal Status: Fetal Heart Rate (bpm): 150 u/s   Movement: Present    Fetal Surveillance Testing today:  [place] 33+4 wks,cephalic,BPP 8/8,FHR 150 BPM,anterior placenta gr 3,afi 15.9 cm,RI .63,.61,.67,.65=71%,EFW 2563 g 82%,AC 95%   Chaperone: N/A    No results found for this or any previous visit (from the past 24 hour(s)).  Assessment & Plan:  High-risk pregnancy: G1P0000 at [redacted]w[redacted]d with an Estimated Date of Delivery: 01/24/21   1) CHTN, stable, no meds, continue ASA  Meds: No orders of the defined types were placed in this encounter.   Labs/procedures today: U/S  Treatment Plan: EFW q 3-4wks, 2x/wk testing dopp/bpp alt w/ NST, deliver @ 38-39.6wks  Reviewed: Preterm labor symptoms and general obstetric precautions including but not limited to vaginal bleeding, contractions, leaking of fluid and fetal movement were reviewed in detail with the patient.  All questions were answered. Does have home bp cuff. Office bp cuff given: not applicable. Check bp weekly, let us know if consistently >150 and/or >95.  Follow-up: Return for As scheduled.   Future Appointments  Date Time Provider Department Center  12/13/2020 10:10 AM [facility] NURSE [facility]  12/20/2020  4:10 PM [facility] NURSE [facility]  12/27/2020  3:50 PM [facility] NURSE [facility]  12/31/2020 10:30 AM [facility] [place] [facility] None  12/31/2020 11:30 AM Cheral Marker, CNM [facility]  01/03/2021  4:10 PM [facility] NURSE [facility]  01/10/2021  4:10 PM [facility] NURSE [facility]  01/14/2021  8:30 AM [facility] [place] [facility] None  01/14/2021  9:50 AM Lazaro Arms, MD [facility]  01/17/2021  4:10 PM [facility] NURSE [facility]    Orders Placed This Encounter  Procedures  . POC Urinalysis Dipstick OB   Cheral Marker CNM, [facility] 12/10/2020 12:40 PM

## 2020-12-13 ENCOUNTER — Ambulatory Visit (INDEPENDENT_AMBULATORY_CARE_PROVIDER_SITE_OTHER): Payer: BC Managed Care – PPO | Admitting: *Deleted

## 2020-12-13 ENCOUNTER — Other Ambulatory Visit: Payer: Self-pay

## 2020-12-13 DIAGNOSIS — O0990 Supervision of high risk pregnancy, unspecified, unspecified trimester: Secondary | ICD-10-CM

## 2020-12-13 DIAGNOSIS — O10919 Unspecified pre-existing hypertension complicating pregnancy, unspecified trimester: Secondary | ICD-10-CM

## 2020-12-13 NOTE — Progress Notes (Addendum)
   NURSE VISIT- NST  SUBJECTIVE:  Mallory Atkins is a 30 y.o. G1P0000 female at [redacted]w[redacted]d, here for a NST for pregnancy complicated by [facility].  She reports active fetal movement, contractions: none, vaginal bleeding: none, membranes: intact.   OBJECTIVE:  BP 130/77   Pulse (!) 118   Wt (!) 352 lb 9.6 oz (159.9 kg)   LMP 04/19/2020 (Exact Date)   BMI 64.49 kg/m   Appears well, no apparent distress  No results found for this or any previous visit (from the past 24 hour(s)).  NST: FHR baseline 145 bpm, Variability: moderate, Accelerations:present, Decelerations:  Absent= Cat 1/reactive Toco: none   ASSESSMENT: G1P0000 at [redacted]w[redacted]d with CHTN NST reactive  PLAN: EFM strip reviewed by Cathie Beams, CNM   Recommendations: keep next appointment as scheduled    Jobe Marker  12/13/2020 11:57 AM  Chart reviewed for nurse visit. Agree with plan of care.  Jacklyn Shell, [place] 12/13/2020 7:31 PM

## 2020-12-14 ENCOUNTER — Other Ambulatory Visit: Payer: BC Managed Care – PPO

## 2020-12-16 ENCOUNTER — Other Ambulatory Visit: Payer: Self-pay

## 2020-12-17 ENCOUNTER — Encounter: Payer: Self-pay | Admitting: Obstetrics & Gynecology

## 2020-12-17 ENCOUNTER — Ambulatory Visit (INDEPENDENT_AMBULATORY_CARE_PROVIDER_SITE_OTHER): Payer: BC Managed Care – PPO

## 2020-12-17 ENCOUNTER — Other Ambulatory Visit: Payer: Self-pay

## 2020-12-17 ENCOUNTER — Ambulatory Visit (INDEPENDENT_AMBULATORY_CARE_PROVIDER_SITE_OTHER): Payer: BC Managed Care – PPO | Admitting: Obstetrics & Gynecology

## 2020-12-17 VITALS — BP 138/95 | HR 112 | Wt 352.8 lb

## 2020-12-17 DIAGNOSIS — O0993 Supervision of high risk pregnancy, unspecified, third trimester: Secondary | ICD-10-CM

## 2020-12-17 DIAGNOSIS — Z3A34 34 weeks gestation of pregnancy: Secondary | ICD-10-CM

## 2020-12-17 DIAGNOSIS — O10919 Unspecified pre-existing hypertension complicating pregnancy, unspecified trimester: Secondary | ICD-10-CM

## 2020-12-17 DIAGNOSIS — O0990 Supervision of high risk pregnancy, unspecified, unspecified trimester: Secondary | ICD-10-CM

## 2020-12-17 LAB — POCT URINALYSIS DIPSTICK OB
Blood, UA: NEGATIVE
Glucose, UA: NEGATIVE
Ketones, UA: NEGATIVE
Leukocytes, UA: NEGATIVE
Nitrite, UA: NEGATIVE
POC,PROTEIN,UA: NEGATIVE

## 2020-12-17 MED ORDER — LABETALOL HCL 200 MG PO TABS: 200.0000 mg | ORAL_TABLET | Freq: Two times a day (BID) | ORAL | 3 refills | Status: DC

## 2020-12-17 NOTE — Progress Notes (Signed)
[place] 34+4 wks,cephalic,BPP 8/8,anterior placenta gr 3,RI .59,.48,.55,.61=53%,FHR 152 bpm,limited view,AFI 22 cm

## 2020-12-17 NOTE — Progress Notes (Signed)
HIGH-RISK PREGNANCY VISIT Patient name: Mallory Atkins MRN [id number]  Date of birth: Oct 16, 1990 Chief Complaint:   Routine Prenatal Visit ([place] today)  History of Present Illness:   Mallory Atkins is a 30 y.o. G85P0000 female at [redacted]w[redacted]d with an Estimated Date of Delivery: 01/24/21 being seen today for ongoing management of a high-risk pregnancy complicated by chronic hypertension currently on begin labetalol 200 BID today.    Today she reports no complaints. Contractions: Not present. Vag. Bleeding: None.  Movement: Present. denies leaking of fluid.   Depression screen [facility] 2/9 10/30/2020 08/03/2020 05/17/2020  Decreased Interest 0 0 0  Down, Depressed, Hopeless 0 0 0  PHQ - 2 Score 0 0 0  Altered sleeping 0 0 0  Tired, decreased energy 1 1 0  Change in appetite 0 0 0  Feeling bad or failure about yourself  0 0 0  Trouble concentrating 0 0 0  Moving slowly or fidgety/restless 0 0 0  Suicidal thoughts 0 0 0  PHQ-9 Score 1 1 0     GAD 7 : Generalized Anxiety Score 10/30/2020 08/03/2020 05/17/2020  Nervous, Anxious, on Edge 0 0 0  Control/stop worrying 0 0 0  Worry too much - different things 0 0 0  Trouble relaxing 0 0 0  Restless 0 0 0  Easily annoyed or irritable 0 0 0  Afraid - awful might happen 0 0 0  Total GAD 7 Score 0 0 0     Review of Systems:   Pertinent items are noted in HPI Denies abnormal vaginal discharge w/ itching/odor/irritation, headaches, visual changes, shortness of breath, chest pain, abdominal pain, severe nausea/vomiting, or problems with urination or bowel movements unless otherwise stated above. Pertinent History Reviewed:  Reviewed past medical,surgical, social, obstetrical and family history.  Reviewed problem list, medications and allergies. Physical Assessment:   Vitals:   12/17/20 0925 12/17/20 0932  BP: (!) 130/95 (!) 138/95  Pulse: (!) 108 (!) 112  Weight: (!) 352 lb 12.8 oz (160 kg)   Body mass index is 64.53 kg/m.           Physical  Examination:   General appearance: alert, well appearing, and in no distress  Mental status: alert, oriented to person, place, and time  Skin: warm & dry   Extremities: Edema: Trace    Cardiovascular: normal heart rate noted  Respiratory: normal respiratory effort, no distress  Abdomen: gravid, soft, non-tender  Pelvic: Cervical exam deferred         Fetal Status:     Movement: Present    Fetal Surveillance Testing today: BPP 8/8 with normal Dopplers   Chaperone: N/A    Results for orders placed or performed in visit on 12/17/20 (from the past 24 hour(s))  POC Urinalysis Dipstick OB   Collection Time: 12/17/20  9:31 AM  Result Value Ref Range   Color, UA     Clarity, UA     Glucose, UA Negative Negative   Bilirubin, UA     Ketones, UA neg    Spec Grav, UA     Blood, UA neg    pH, UA     POC,PROTEIN,UA Negative Negative, Trace, Small (1+), Moderate (2+), Large (3+), 4+   Urobilinogen, UA     Nitrite, UA neg    Leukocytes, UA Negative Negative   Appearance     Odor      Assessment & Plan:  High-risk pregnancy: G1P0000 at [redacted]w[redacted]d with an Estimated Date of Delivery: 01/24/21  1) CHTN, labetalol 200 BID begun today, stable    Meds:  Meds ordered this encounter  Medications   labetalol (NORMODYNE) 200 MG tablet    Sig: Take 1 tablet (200 mg total) by mouth 2 (two) times daily.    Dispense:  60 tablet    Refill:  3    Labs/procedures today: U/S  Treatment Plan:  twice weekly testing, IOL based on clinical course 39 weeks for now  Reviewed: Preterm labor symptoms and general obstetric precautions including but not limited to vaginal bleeding, contractions, leaking of fluid and fetal movement were reviewed in detail with the patient.  All questions were answered. Does have home bp cuff. Office bp cuff given: not applicable. Check bp daily, let us know if consistently >150 and/or >95.  Follow-up: Return for keep scheduled.   Future Appointments  Date Time Provider  Department Center  12/20/2020  4:10 PM [facility] NURSE [facility]  12/27/2020  3:50 PM [facility] NURSE [facility]  12/31/2020 10:30 AM [facility] [place] [facility] None  12/31/2020 11:30 AM Cheral Marker, CNM [facility]  01/03/2021  4:10 PM [facility] NURSE [facility]  01/10/2021  4:10 PM [facility] NURSE [facility]  01/14/2021  8:30 AM [facility] [place] [facility] None  01/14/2021  9:50 AM Lazaro Arms, MD [facility]  01/17/2021  4:10 PM [facility] NURSE [facility]    Orders Placed This Encounter  Procedures   POC Urinalysis Dipstick OB   Amaryllis Dyke Orvella Digiulio  12/17/2020 10:08 AM

## 2020-12-20 ENCOUNTER — Other Ambulatory Visit: Payer: BC Managed Care – PPO

## 2020-12-21 ENCOUNTER — Other Ambulatory Visit: Payer: BC Managed Care – PPO

## 2020-12-24 ENCOUNTER — Ambulatory Visit (INDEPENDENT_AMBULATORY_CARE_PROVIDER_SITE_OTHER): Payer: BC Managed Care – PPO | Admitting: Obstetrics & Gynecology

## 2020-12-24 ENCOUNTER — Encounter: Payer: Self-pay | Admitting: Obstetrics & Gynecology

## 2020-12-24 ENCOUNTER — Other Ambulatory Visit: Payer: Self-pay

## 2020-12-24 ENCOUNTER — Other Ambulatory Visit (HOSPITAL_COMMUNITY)
Admission: RE | Admit: 2020-12-24 | Discharge: 2020-12-24 | Disposition: A | Discharge status: Home / Self Care | Payer: BC Managed Care – PPO | Source: Ambulatory Visit | Attending: Obstetrics & Gynecology | Admitting: Obstetrics & Gynecology

## 2020-12-24 ENCOUNTER — Ambulatory Visit (INDEPENDENT_AMBULATORY_CARE_PROVIDER_SITE_OTHER): Payer: BC Managed Care – PPO

## 2020-12-24 VITALS — BP 130/86 | HR 91 | Wt 357.8 lb

## 2020-12-24 DIAGNOSIS — O0993 Supervision of high risk pregnancy, unspecified, third trimester: Secondary | ICD-10-CM | POA: Diagnosis not present

## 2020-12-24 DIAGNOSIS — O10919 Unspecified pre-existing hypertension complicating pregnancy, unspecified trimester: Secondary | ICD-10-CM

## 2020-12-24 DIAGNOSIS — O0990 Supervision of high risk pregnancy, unspecified, unspecified trimester: Secondary | ICD-10-CM | POA: Diagnosis not present

## 2020-12-24 DIAGNOSIS — Z3A35 35 weeks gestation of pregnancy: Secondary | ICD-10-CM | POA: Diagnosis not present

## 2020-12-24 DIAGNOSIS — O99210 Obesity complicating pregnancy, unspecified trimester: Secondary | ICD-10-CM

## 2020-12-24 LAB — POCT URINALYSIS DIPSTICK OB
Blood, UA: NEGATIVE
Glucose, UA: NEGATIVE
Ketones, UA: NEGATIVE
Leukocytes, UA: NEGATIVE
Nitrite, UA: NEGATIVE
POC,PROTEIN,UA: NEGATIVE

## 2020-12-24 LAB — OB RESULTS CONSOLE GC/CHLAMYDIA: Gonorrhea: NEGATIVE

## 2020-12-24 NOTE — Progress Notes (Signed)
[place] 35+4 wks,cephalic,BPP 8/8,FHR 142 bpm,anterior placenta gr 3,AFI 18 cm,RI .58,.60,.55=50%

## 2020-12-24 NOTE — Progress Notes (Signed)
HIGH-RISK PREGNANCY VISIT Patient name: Mallory Atkins MRN [id number]  Date of birth: 1991/04/14 Chief Complaint:   Routine Prenatal Visit ([place] today)  History of Present Illness:   Mallory Atkins is a 30 y.o. G1P0000 female at [redacted]w[redacted]d with an Estimated Date of Delivery: 01/24/21 being seen today for ongoing management of a high-risk pregnancy complicated by  1) chronic HTN -on Labetalol 200mg  bid, ASA daily  2) obesity- BMI: 65 3) A neg, Rhogam given  Today she reports considerable acid reflux- recently increased to pepcid 20mg  twice daily  Contractions: Not present. Vag. Bleeding: None.  Movement: Present. denies leaking of fluid.   Depression screen [facility] 2/9 10/30/2020 08/03/2020 05/17/2020  Decreased Interest 0 0 0  Down, Depressed, Hopeless 0 0 0  PHQ - 2 Score 0 0 0  Altered sleeping 0 0 0  Tired, decreased energy 1 1 0  Change in appetite 0 0 0  Feeling bad or failure about yourself  0 0 0  Trouble concentrating 0 0 0  Moving slowly or fidgety/restless 0 0 0  Suicidal thoughts 0 0 0  PHQ-9 Score 1 1 0     Current Outpatient Medications  Medication Instructions   acetaminophen (TYLENOL) 500 MG tablet Oral   acyclovir (ZOVIRAX) 400 mg, Oral, 3 times daily   aspirin EC 81 mg, Oral, Daily, Swallow whole.   Calcium Carbonate Antacid (TUMS PO) Oral   famotidine (PEPCID) 10 mg, Oral, Daily   labetalol (NORMODYNE) 200 mg, Oral, 2 times daily   ondansetron (ZOFRAN) 4 mg, Oral, Every 8 hours PRN   Prenatal MV & Min w/FA-DHA (PRENATAL GUMMIES) 0.18-25 MG CHEW 1 tablet, Oral, Daily   SUMAtriptan (IMITREX) 50 MG tablet Oral   Vitamin D (Ergocalciferol) (DRISDOL) 50,000 Units, Oral, Weekly     Review of Systems:   Pertinent items are noted in HPI Denies abnormal vaginal discharge w/ itching/odor/irritation, headaches, visual changes, shortness of breath, chest pain, abdominal pain, severe nausea/vomiting, or problems with urination or bowel movements unless otherwise stated  above. Pertinent History Reviewed:  Reviewed past medical,surgical, social, obstetrical and family history.  Reviewed problem list, medications and allergies. Physical Assessment:   Vitals:   12/24/20 1212 12/24/20 1217  BP: 139/90 130/86  Pulse: 87 91  Weight: (!) 357 lb 12.8 oz (162.3 kg)   Body mass index is 65.44 kg/m.           Physical Examination:   General appearance: alert, well appearing, and in no distress  Mental status: alert, oriented to person, place, and time  Skin: warm & dry   Extremities: Edema: Trace    Cardiovascular: normal heart rate noted  Respiratory: normal respiratory effort, no distress  Abdomen: gravid, soft, non-tender  Pelvic: Cervical exam deferred , vaginal swabs obtained        Fetal Status:     Movement: Present  FHT: 142 by 12/26/20  Fetal Surveillance Testing today: BPP- cephalic,BPP 8/8,FHR 142 bpm,anterior placenta gr 3,AFI 18 cm,RI .58,.60,.55=50% - normal dopplers  Chaperone: 12/26/20    Results for orders placed or performed in visit on 12/24/20 (from the past 24 hour(s))  POC Urinalysis Dipstick OB   Collection Time: 12/24/20 12:17 PM  Result Value Ref Range   Color, UA     Clarity, UA     Glucose, UA Negative Negative   Bilirubin, UA     Ketones, UA neg    Spec Grav, UA     Blood, UA neg    pH,  UA     POC,PROTEIN,UA Negative Negative, Trace, Small (1+), Moderate (2+), Large (3+), 4+   Urobilinogen, UA     Nitrite, UA neg    Leukocytes, UA Negative Negative   Appearance     Odor       Assessment & Plan:  High-risk pregnancy: G1P0000 at [redacted]w[redacted]d with an Estimated Date of Delivery: 01/24/21   1) chronic HTN -on Labetalol 200mg  bid, ASA daily- BP stable -continue antepartum testing []  IOL 37-39wks  2) GERD -continue pepcid -reviewed conservative management  Meds: No orders of the defined types were placed in this encounter.   Labs/procedures today: GBS, GCC, BPP/US  Treatment Plan:  continue with antepartum  testing.  Reviewed plan for IOL  Reviewed: Preterm labor symptoms and general obstetric precautions including but not limited to vaginal bleeding, contractions, leaking of fluid and fetal movement were reviewed in detail with the patient.  All questions were answered. Pt has home bp cuff. Check bp weekly, let know if >140/90.   Follow-up: Return in about 1 week (around 12/31/2020) for HROB visit.   Future Appointments  Date Time Provider Department Center  12/24/2020 12:30 PM 01/02/2021, DO [facility]  12/27/2020  3:50 PM [facility] NURSE [facility]  12/31/2020 10:30 AM [facility] 12/29/2020 [facility] None  12/31/2020 11:30 AM [place], CNM [facility]  01/03/2021  4:10 PM [facility] NURSE [facility]  01/10/2021  4:10 PM [facility] NURSE [facility]  01/14/2021  8:30 AM [facility] 03/13/2021 [facility] None  01/14/2021  9:50 AM [place], MD [facility]  01/17/2021  4:10 PM [facility] NURSE [facility]    Orders Placed This Encounter  Procedures   POC Urinalysis Dipstick OB    Lazaro Arms, DO Attending Obstetrician & Gynecologist, [facility] for 01/19/2021, [facility]

## 2020-12-25 LAB — CERVICOVAGINAL ANCILLARY ONLY
Chlamydia: NEGATIVE
Comment: NEGATIVE
Comment: NORMAL
Neisseria Gonorrhea: NEGATIVE

## 2020-12-27 ENCOUNTER — Other Ambulatory Visit: Payer: Self-pay

## 2020-12-27 ENCOUNTER — Ambulatory Visit (INDEPENDENT_AMBULATORY_CARE_PROVIDER_SITE_OTHER): Payer: BC Managed Care – PPO | Admitting: *Deleted

## 2020-12-27 VITALS — BP 131/79 | HR 85 | Wt 360.4 lb

## 2020-12-27 DIAGNOSIS — Z1389 Encounter for screening for other disorder: Secondary | ICD-10-CM

## 2020-12-27 DIAGNOSIS — O99210 Obesity complicating pregnancy, unspecified trimester: Secondary | ICD-10-CM

## 2020-12-27 DIAGNOSIS — O0990 Supervision of high risk pregnancy, unspecified, unspecified trimester: Secondary | ICD-10-CM

## 2020-12-27 DIAGNOSIS — Z331 Pregnant state, incidental: Secondary | ICD-10-CM

## 2020-12-27 DIAGNOSIS — O288 Other abnormal findings on antenatal screening of mother: Secondary | ICD-10-CM

## 2020-12-27 LAB — POCT URINALYSIS DIPSTICK OB
Blood, UA: NEGATIVE
Glucose, UA: NEGATIVE
Ketones, UA: NEGATIVE
Leukocytes, UA: NEGATIVE
Nitrite, UA: NEGATIVE

## 2020-12-27 NOTE — Progress Notes (Signed)
   NURSE VISIT- NST  SUBJECTIVE:  Juliette Standre is a 30 y.o. G1P0000 female at [redacted]w[redacted]d, here for a NST for pregnancy complicated by [facility].  She reports active fetal movement, contractions: none, vaginal bleeding: none, membranes: intact.   OBJECTIVE:  BP 131/79   Pulse 85   Wt (!) 360 lb 6.4 oz (163.5 kg)   LMP 04/19/2020 (Exact Date)   BMI 65.92 kg/m   Appears well, no apparent distress  Results for orders placed or performed in visit on 12/27/20 (from the past 24 hour(s))  POC Urinalysis Dipstick OB   Collection Time: 12/27/20  4:40 PM  Result Value Ref Range   Color, UA     Clarity, UA     Glucose, UA Negative Negative   Bilirubin, UA     Ketones, UA neg    Spec Grav, UA     Blood, UA neg    pH, UA     POC,PROTEIN,UA Trace Negative, Trace, Small (1+), Moderate (2+), Large (3+), 4+   Urobilinogen, UA     Nitrite, UA neg    Leukocytes, UA Negative Negative   Appearance     Odor      NST: FHR baseline 140 bpm, Variability: moderate, Accelerations:present, Decelerations:  Absent= Cat 1/reactive Toco: none   ASSESSMENT: G1P0000 at [redacted]w[redacted]d with CHTN NST reactive  PLAN: EFM strip reviewed by Dr. Charlotta Newton   Recommendations: keep next appointment as scheduled    Annamarie Dawley  12/27/2020 4:54 PM

## 2020-12-28 ENCOUNTER — Inpatient Hospital Stay (HOSPITAL_COMMUNITY)
Admission: AD | Admit: 2020-12-28 | Discharge: 2020-12-28 | Disposition: A | Discharge status: Home / Self Care | Payer: BC Managed Care – PPO | Attending: Obstetrics & Gynecology | Admitting: Obstetrics & Gynecology

## 2020-12-28 ENCOUNTER — Other Ambulatory Visit: Payer: BC Managed Care – PPO

## 2020-12-28 ENCOUNTER — Encounter (HOSPITAL_COMMUNITY): Payer: Self-pay | Admitting: Obstetrics & Gynecology

## 2020-12-28 DIAGNOSIS — Z3689 Encounter for other specified antenatal screening: Secondary | ICD-10-CM

## 2020-12-28 DIAGNOSIS — Z3A36 36 weeks gestation of pregnancy: Secondary | ICD-10-CM | POA: Diagnosis not present

## 2020-12-28 DIAGNOSIS — Z8249 Family history of ischemic heart disease and other diseases of the circulatory system: Secondary | ICD-10-CM | POA: Insufficient documentation

## 2020-12-28 DIAGNOSIS — Z7982 Long term (current) use of aspirin: Secondary | ICD-10-CM | POA: Insufficient documentation

## 2020-12-28 DIAGNOSIS — Z79899 Other long term (current) drug therapy: Secondary | ICD-10-CM | POA: Diagnosis not present

## 2020-12-28 DIAGNOSIS — O163 Unspecified maternal hypertension, third trimester: Secondary | ICD-10-CM | POA: Diagnosis not present

## 2020-12-28 DIAGNOSIS — O10913 Unspecified pre-existing hypertension complicating pregnancy, third trimester: Secondary | ICD-10-CM

## 2020-12-28 DIAGNOSIS — O10919 Unspecified pre-existing hypertension complicating pregnancy, unspecified trimester: Secondary | ICD-10-CM

## 2020-12-28 HISTORY — DX: Essential (primary) hypertension: I10

## 2020-12-28 LAB — CBC
HCT: 31.3 % — ABNORMAL LOW (ref 36.0–46.0)
Hemoglobin: 10.2 g/dL — ABNORMAL LOW (ref 12.0–15.0)
MCH: 29.8 pg (ref 26.0–34.0)
MCHC: 32.6 g/dL (ref 30.0–36.0)
MCV: 91.5 fL (ref 80.0–100.0)
Platelets: 210 10*3/uL (ref 150–400)
RBC: 3.42 MIL/uL — ABNORMAL LOW (ref 3.87–5.11)
RDW: 14.1 % (ref 11.5–15.5)
WBC: 11.9 10*3/uL — ABNORMAL HIGH (ref 4.0–10.5)
nRBC: 0 % (ref 0.0–0.2)

## 2020-12-28 LAB — COMPREHENSIVE METABOLIC PANEL
ALT: 10 U/L (ref 0–44)
AST: 15 U/L (ref 15–41)
Albumin: 2.6 g/dL — ABNORMAL LOW (ref 3.5–5.0)
Alkaline Phosphatase: 86 U/L (ref 38–126)
Anion gap: 6 (ref 5–15)
BUN: 9 mg/dL (ref 6–20)
CO2: 24 mmol/L (ref 22–32)
Calcium: 8.4 mg/dL — ABNORMAL LOW (ref 8.9–10.3)
Chloride: 106 mmol/L (ref 98–111)
Creatinine, Ser: 0.71 mg/dL (ref 0.44–1.00)
GFR, Estimated: >60 mL/min (ref >60)
Glucose, Bld: 97 mg/dL (ref 70–99)
Potassium: 3.6 mmol/L (ref 3.5–5.1)
Sodium: 136 mmol/L (ref 135–145)
Total Bilirubin: 0.3 mg/dL (ref 0.3–1.2)
Total Protein: 6.1 g/dL — ABNORMAL LOW (ref 6.5–8.1)

## 2020-12-28 LAB — PROTEIN / CREATININE RATIO, URINE
Creatinine, Urine: 206.28 mg/dL
Protein Creatinine Ratio: 0.06 mg/mg{Cre} (ref 0.00–0.15)
Total Protein, Urine: 12 mg/dL

## 2020-12-28 LAB — CULTURE, BETA STREP (GROUP B ONLY): Strep Gp B Culture: NEGATIVE

## 2020-12-28 MED ORDER — LABETALOL HCL 100 MG PO TABS
200.0000 mg | ORAL_TABLET | Freq: Once | ORAL | Status: AC
  Administered 2020-12-28: 200 mg via ORAL
  Filled 2020-12-28: qty 2

## 2020-12-28 NOTE — MAU Note (Signed)
Pt stated she has been having elevated b/ps. Started on Labetalol and NST twice a week. Had protein in her urine yesterday.B/P was elevated this morning then went down  elevated again this evening 157/75. Denies any headache or visual changes. Took labetalol at lunch time.feet are swollen but have been that way for about a week. Good fetal movement.

## 2020-12-28 NOTE — MAU Note (Signed)
Reviewed at length s/s pre eclampsia as well as s/s labor and when to return or call provider. Pt and husband voice understanding and would like to go home with call from provider after PCR results and she is needed to return. Pts BP remain within range. Pt continues to deny HA, visual changes or epigastric pain. Encouraged pt to remain on low salt diet and hydration. Voiced understanding of all.

## 2020-12-28 NOTE — MAU Provider Note (Signed)
History     CSN: [id number]  Arrival date and time: 12/28/20 2005   None     Chief Complaint  Patient presents with   Hypertension   Mallory Atkins is a 30 y.o. G1P0 at [redacted]w[redacted]d who receives care at [facility].  She presents today for Hypertension.  She states her blood pressure has been "up and down all day."  She reports around 1830 she called the nurse line and was instructed to report for further evaluation. She states she does not have high blood pressure prior to pregnancy, but was started on Labetalol 200mg  BID and bASA.  She states she took her first dose of labetalol at 1pm, but took her blood pressure this morning because she was "feeling funny." Patient denies HA, visual disturbances, SOB, and RUQ pain.  She endorses fetal movement and abdominal cramping that occurred this morning.  She denies contractions.  She denies vaginal bleeding, leaking, and discharge.    OB History     Gravida  1   Para  0   Term  0   Preterm  0   AB  0   Living  0      SAB  0   IAB  0   Ectopic  0   Multiple  0   Live Births  0           Past Medical History:  Diagnosis Date   Hidradenitis suppurativa    treated with remicade infusions every 6 weeks   Hypertension    Pre eclampsia   Migraines     Past Surgical History:  Procedure Laterality Date   CHOLECYSTECTOMY  2009    Family History  Problem Relation Age of Onset   Hypertension Maternal Grandmother    Skin cancer Maternal Grandfather    Hypertension Maternal Grandfather    Hypertension Mother    Ovarian cysts Mother    Fibroids Mother    Diabetes Maternal Aunt    Epilepsy Maternal Aunt    Stroke Maternal Aunt    COPD Maternal Aunt    Diabetes Maternal Aunt    Asthma Maternal Aunt     Social History   Tobacco Use   Smoking status: Never   Smokeless tobacco: Never  Substance Use Topics   Alcohol use: Not Currently    Comment: 6 months ago   Drug use: Not Currently    Types: Marijuana     Allergies: No Known Allergies  Medications Prior to Admission  Medication Sig Dispense Refill Last Dose   acetaminophen (TYLENOL) 500 MG tablet Take by mouth.   Past Week   aspirin EC 81 MG tablet Take 81 mg by mouth daily. Swallow whole.   12/28/2020 at 0800   Calcium Carbonate Antacid (TUMS PO) Take by mouth.   12/28/2020 at 1500   famotidine (PEPCID) 10 MG tablet Take 20 mg by mouth 2 (two) times daily.   12/28/2020 at 0800   labetalol (NORMODYNE) 200 MG tablet Take 1 tablet (200 mg total) by mouth 2 (two) times daily. 60 tablet 3 12/28/2020 at 1300   Prenatal MV & Min w/FA-DHA (PRENATAL GUMMIES) 0.18-25 MG CHEW Chew 1 tablet by mouth daily. 30 tablet 2 12/28/2020   Vitamin D, Ergocalciferol, (DRISDOL) 1.25 MG (50000 UNIT) CAPS capsule Take 50,000 Units by mouth once a week.   Past Week   acyclovir (ZOVIRAX) 400 MG tablet Take 1 tablet (400 mg total) by mouth 3 (three) times daily. (Patient not taking: No sig reported)  90 tablet 3    ondansetron (ZOFRAN) 4 MG tablet Take 1 tablet (4 mg total) by mouth every 8 (eight) hours as needed for nausea or vomiting. (Patient not taking: No sig reported) 20 tablet 0    SUMAtriptan (IMITREX) 50 MG tablet Take by mouth. (Patient not taking: Reported on 12/27/2020)       Review of Systems  Eyes:  Negative for visual disturbance.  Respiratory:  Negative for cough and shortness of breath.   Gastrointestinal:  Negative for abdominal pain.  Genitourinary:  Negative for vaginal bleeding and vaginal discharge.  Neurological:  Negative for dizziness, light-headedness and headaches.  Physical Exam   Blood pressure 111/74, pulse (!) 106, resp. rate 18, height 5\' 2"  (1.575 m), weight (!) 164.2 kg, last menstrual period 04/19/2020.  Vitals:   12/28/20 2214 12/28/20 2231 12/28/20 2246 12/28/20 2301  BP: 124/81 115/77 121/71   Pulse: 89 89 91   Resp:   18   Temp:    97.7 F (36.5 C)  TempSrc:    Oral  SpO2:      Weight:      Height:         Physical  Exam Constitutional:      General: She is not in acute distress.    Appearance: Normal appearance. She is obese. She is not ill-appearing.  HENT:     Head: Normocephalic and atraumatic.  Eyes:     Conjunctiva/sclera: Conjunctivae normal.  Cardiovascular:     Rate and Rhythm: Normal rate and regular rhythm.     Heart sounds: Normal heart sounds.  Pulmonary:     Effort: Pulmonary effort is normal. No respiratory distress.     Breath sounds: Normal breath sounds.  Abdominal:     Tenderness: There is no abdominal tenderness.  Musculoskeletal:     Cervical back: Normal range of motion.  Skin:    General: Skin is warm and dry.  Neurological:     Mental Status: She is alert and oriented to person, place, and time.  Psychiatric:        Mood and Affect: Mood normal.        Behavior: Behavior normal.        Thought Content: Thought content normal.    Fetal Assessment 145 bpm, Mod Var, -Decels, +Accels Toco: No ctx graphed  MAU Course   Results for orders placed or performed during the hospital encounter of 12/28/20 (from the past 24 hour(s))  CBC     Status: Abnormal   Collection Time: 12/28/20  8:40 PM  Result Value Ref Range   WBC 11.9 (H) 4.0 - 10.5 K/uL   RBC 3.42 (Atkins) 3.87 - 5.11 MIL/uL   Hemoglobin 10.2 (Atkins) 12.0 - 15.0 g/dL   HCT 12/30/20 (Atkins) 35.3 - 29.9 %   MCV 91.5 80.0 - 100.0 fL   MCH 29.8 26.0 - 34.0 pg   MCHC 32.6 30.0 - 36.0 g/dL   RDW 24.2 68.3 - 41.9 %   Platelets 210 150 - 400 K/uL   nRBC 0.0 0.0 - 0.2 %   No results found.  MDM Physical Exam Labs: CBC, CMP, PC Ratio Measure BPQ15 min EFM Assessment and Plan  30 year old G1P0  SIUP at 36.1 weeks Cat I FT CHTN  -Labs ordered and collected. -Provider to bedside to discuss results and inform that blood pressures normotensive. -Will give 2nd dose of Labetalol now. -Will await for remainder of results to return.  -NST Reactive  Mallory Atkins  Mallory Kilker MSN, CNM 12/28/2020, 9:11 PM   Reassessment (23:03  PM) -Labs return normal, but PC Ratio pending. -Nurse reports she did not send UA to lab. -Nurse instructed to inform patient of her oversight and offer patient discharge with acknowledgement that if PC Ratio returns abnormal, patient would have to return. -Nurse reports patient agreeable to returning if abnormal results found. -BP remain normotensive. -Patient discharged to home in stable condition.   Addendum (1:02 AM) -PC Ratio returns normal. -No further testing necessary. -Message sent to patient via mychart.  Mallory Robins MSN, CNM Advanced Practice Provider, Center for [employer]

## 2020-12-31 ENCOUNTER — Ambulatory Visit (INDEPENDENT_AMBULATORY_CARE_PROVIDER_SITE_OTHER): Payer: BC Managed Care – PPO

## 2020-12-31 ENCOUNTER — Encounter: Payer: Self-pay | Admitting: Women's Health

## 2020-12-31 ENCOUNTER — Ambulatory Visit (INDEPENDENT_AMBULATORY_CARE_PROVIDER_SITE_OTHER): Payer: BC Managed Care – PPO | Admitting: Women's Health

## 2020-12-31 ENCOUNTER — Other Ambulatory Visit: Payer: Self-pay

## 2020-12-31 VITALS — BP 131/85 | HR 87 | Wt 359.8 lb

## 2020-12-31 DIAGNOSIS — O10919 Unspecified pre-existing hypertension complicating pregnancy, unspecified trimester: Secondary | ICD-10-CM

## 2020-12-31 DIAGNOSIS — Z1389 Encounter for screening for other disorder: Secondary | ICD-10-CM

## 2020-12-31 DIAGNOSIS — O0993 Supervision of high risk pregnancy, unspecified, third trimester: Secondary | ICD-10-CM

## 2020-12-31 DIAGNOSIS — O99210 Obesity complicating pregnancy, unspecified trimester: Secondary | ICD-10-CM

## 2020-12-31 DIAGNOSIS — O0990 Supervision of high risk pregnancy, unspecified, unspecified trimester: Secondary | ICD-10-CM

## 2020-12-31 DIAGNOSIS — Z3A36 36 weeks gestation of pregnancy: Secondary | ICD-10-CM | POA: Diagnosis not present

## 2020-12-31 LAB — POCT URINALYSIS DIPSTICK OB
Blood, UA: NEGATIVE
Glucose, UA: NEGATIVE
Ketones, UA: NEGATIVE
Leukocytes, UA: NEGATIVE
Nitrite, UA: NEGATIVE

## 2020-12-31 NOTE — Progress Notes (Signed)
HIGH-RISK PREGNANCY VISIT Patient name: Mallory Atkins MRN [id number]  Date of birth: 17-Jul-1990 Chief Complaint:   Routine Prenatal Visit, High Risk Gestation, and Pregnancy Ultrasound  History of Present Illness:   Mallory Atkins is a 30 y.o. G25P0000 female at [redacted]w[redacted]d with an Estimated Date of Delivery: 01/24/21 being seen today for ongoing management of a high-risk pregnancy complicated by chronic hypertension currently on Labetalol 200mg  BID.    Today she reports  bp's up & down at home, had multiple elevated on Friday, went to [facility], bp's good there and labs normal.  Denies ha, visual changes, ruq/epigastric pain, n/v.   Contractions: Irritability. Vag. Bleeding: None.  Movement: Present. denies leaking of fluid.   Depression screen [facility] 2/9 10/30/2020 08/03/2020 05/17/2020  Decreased Interest 0 0 0  Down, Depressed, Hopeless 0 0 0  PHQ - 2 Score 0 0 0  Altered sleeping 0 0 0  Tired, decreased energy 1 1 0  Change in appetite 0 0 0  Feeling bad or failure about yourself  0 0 0  Trouble concentrating 0 0 0  Moving slowly or fidgety/restless 0 0 0  Suicidal thoughts 0 0 0  PHQ-9 Score 1 1 0     GAD 7 : Generalized Anxiety Score 10/30/2020 08/03/2020 05/17/2020  Nervous, Anxious, on Edge 0 0 0  Control/stop worrying 0 0 0  Worry too much - different things 0 0 0  Trouble relaxing 0 0 0  Restless 0 0 0  Easily annoyed or irritable 0 0 0  Afraid - awful might happen 0 0 0  Total GAD 7 Score 0 0 0     Review of Systems:   Pertinent items are noted in HPI Denies abnormal vaginal discharge w/ itching/odor/irritation, headaches, visual changes, shortness of breath, chest pain, abdominal pain, severe nausea/vomiting, or problems with urination or bowel movements unless otherwise stated above. Pertinent History Reviewed:  Reviewed past medical,surgical, social, obstetrical and family history.  Reviewed problem list, medications and allergies. Physical Assessment:   Vitals:   12/31/20 1147   BP: 131/85  Pulse: 87  Weight: (!) 359 lb 12.8 oz (163.2 kg)  Body mass index is 65.81 kg/m.           Physical Examination:   General appearance: alert, well appearing, and in no distress  Mental status: alert, oriented to person, place, and time  Skin: warm & dry   Extremities: Edema: Trace    Cardiovascular: normal heart rate noted  Respiratory: normal respiratory effort, no distress  Abdomen: gravid, soft, non-tender  Pelvic: Cervical exam performed    Effacement (%): Thick Station: Ballotable, cx very anterior/under pubic bone, can't get all the way to inner os/thick/couldn't feel vtx  Fetal Status: Fetal Heart Rate (bpm): 160 u/s   Movement: Present Presentation: Vertex  Fetal Surveillance Testing today:  01/02/21 36+4 wks,cephalic,BPP 8/8,RI.51,.51,.59=40%,EFW 3343 g 85%,anterior placenta gr 3,AFI 10.9 cm,fhr 160 BPM  Chaperone: [place]    Results for orders placed or performed in visit on 12/31/20 (from the past 24 hour(s))  POC Urinalysis Dipstick OB   Collection Time: 12/31/20 11:33 AM  Result Value Ref Range   Color, UA     Clarity, UA     Glucose, UA Negative Negative   Bilirubin, UA     Ketones, UA neg    Spec Grav, UA     Blood, UA neg    pH, UA     POC,PROTEIN,UA Trace Negative, Trace, Small (1+), Moderate (2+), Large (  3+), 4+   Urobilinogen, UA     Nitrite, UA neg    Leukocytes, UA Negative Negative   Appearance     Odor      Assessment & Plan:  High-risk pregnancy: G1P0000 at [redacted]w[redacted]d with an Estimated Date of Delivery: 01/24/21   1) CHTN, stable on Labetalol 200mg  BID, ASA, nl pre-e labs 6/24. Reviewed pre-e s/s, reasons to seek care   Meds: No orders of the defined types were placed in this encounter.   Labs/procedures today: SVE and U/S  Treatment Plan:  Growth u/s q 4wks    2x/wk testing nst/sono @ 32wks     Deliver 38-39wks (37wks or prn if poor control)____   Reviewed: Preterm labor symptoms and general obstetric precautions including but not  limited to vaginal bleeding, contractions, leaking of fluid and fetal movement were reviewed in detail with the patient.  All questions were answered. Does have home bp cuff. Office bp cuff given: not applicable. Check bp twice daily, let 7/24 know if consistently >160 and/or >110.  Follow-up: Return for As scheduled 6/30 nst/nurse; needs 7/5 HROB w MD/CNM and bpp/dopp> move nurse/nst visit to 7/8.   Future Appointments  Date Time Provider Department Center  01/03/2021  4:10 PM [facility] NURSE [facility]  01/10/2021  4:10 PM [facility] NURSE [facility]  01/14/2021  8:30 AM [facility] 03/17/2021 [facility] None  01/14/2021  9:50 AM 03/17/2021, MD [facility]  01/17/2021  4:10 PM [facility] NURSE [facility]    Orders Placed This Encounter  Procedures   POC Urinalysis Dipstick OB   01/19/2021 CNM, [facility] 12/31/2020 1:05 PM

## 2020-12-31 NOTE — Progress Notes (Signed)
[place] 36+4 wks,cephalic,BPP 8/8,RI.51,.51,.59=40%,EFW 3343 g 85%,anterior placenta gr 3,AFI 10.9 cm,fhr 160 BPM

## 2020-12-31 NOTE — Patient Instructions (Signed)
Meylin, thank you for choosing our office today! We appreciate the opportunity to meet your healthcare needs. You may receive a short survey by mail, e-mail, or through MyChart. If you are happy with your care we would appreciate if you could take just a few minutes to complete the survey questions. We read all of your comments and take your feedback very seriously. Thank you again for choosing our office.  [facility]  [facility] ([address] [place], [place] [address]) Entrance C, located off of [address] Free 24/7 valet parking   CLASSES: Go to [email or web address] to register for classes (childbirth, breastfeeding, waterbirth, infant CPR, daddy bootcamp, etc.)  Call the office ([phone]) or go to [facility] if: You begin to have strong, frequent contractions Your water breaks.  Sometimes it is a big gush of fluid, sometimes it is just a trickle that keeps getting your panties wet or running down your legs You have vaginal bleeding.  It is normal to have a small amount of spotting if your cervix was checked.  You don't feel your baby moving like normal.  If you don't, get you something to eat and drink and lay down and focus on feeling your baby move.   If your baby is still not moving like normal, you should call the office or go to [facility].  Call the office ([phone]) or go to [facility] for these signs of pre-eclampsia: Severe headache that does not go away with Tylenol Visual changes- seeing spots, double, blurred vision Pain under your right breast or upper abdomen that does not go away with Tums or heartburn medicine Nausea and/or vomiting Severe swelling in your hands, feet, and face   Tdap Vaccine It is recommended that you get the Tdap vaccine during the third trimester of EACH pregnancy to help protect your baby from getting pertussis (whooping cough) 27-36 weeks is the BEST time to do this  so that you can pass the protection on to your baby. During pregnancy is better than after pregnancy, but if you are unable to get it during pregnancy it will be offered at the hospital.  You can get this vaccine with us, at the health department, your family doctor, or some local pharmacies Everyone who will be around your baby should also be up-to-date on their vaccines before the baby comes. Adults (who are not pregnant) only need 1 dose of Tdap during adulthood.   [place] Pediatricians/Family Doctors [facility] ([facility]): [address], [phone]           [facility]: [address], [phone]                [facility] ([facility]): [address], [phone] (call to ask if accepting patients) [facility]: [address], [place], [phone]    [place] Pediatricians/Family Doctors [facility] ([facility]): [address], [phone] [facility]: [address], [phone] [facility]: [address], [phone]  [place] [facility]  [facility] ([facility]): [phone] [facility]: [address], [phone]   [place] Family Doctors [facility]: [address], [phone]  [place] [facility]  [facility]: [address], [phone]  Home Blood Pressure Monitoring for Patients   Your provider has recommended that you check your   blood pressure (BP) at least once a week at home. If you do not have a blood pressure cuff at home, one will be provided for you. Contact your provider if you have not received your monitor within 1 week.   Helpful Tips for Accurate Home Blood Pressure Checks  Don't smoke, exercise, or drink caffeine 30 minutes before checking your BP Use the restroom before checking your BP (a full bladder can raise your  pressure) Relax in a comfortable upright chair Feet on the ground Left arm resting comfortably on a flat surface at the level of your heart Legs uncrossed Back supported Sit quietly and don't talk Place the cuff on your bare arm Adjust snuggly, so that only two fingertips can fit between your skin and the top of the cuff Check 2 readings separated by at least one minute Keep a log of your BP readings For a visual, please reference this diagram: http://ccnc.care/bpdiagram  Provider Name: [facility]     Phone: [phone]  Zone 1: ALL CLEAR  Continue to monitor your symptoms:  BP reading is less than 140 (top number) or less than 90 (bottom number)  No right upper stomach pain No headaches or seeing spots No feeling nauseated or throwing up No swelling in face and hands  Zone 2: CAUTION Call your doctor's office for any of the following:  BP reading is greater than 140 (top number) or greater than 90 (bottom number)  Stomach pain under your ribs in the middle or right side Headaches or seeing spots Feeling nauseated or throwing up Swelling in face and hands  Zone 3: EMERGENCY  Seek immediate medical care if you have any of the following:  BP reading is greater than160 (top number) or greater than 110 (bottom number) Severe headaches not improving with Tylenol Serious difficulty catching your breath Any worsening symptoms from Zone 2  Preterm Labor and Birth Information  The normal length of a pregnancy is 39-41 weeks. Preterm labor is when labor starts before 37 completed weeks of pregnancy. What are the risk factors for preterm labor? Preterm labor is more likely to occur in women who: Have certain infections during pregnancy such as a bladder infection, sexually transmitted infection, or infection inside the uterus (chorioamnionitis). Have a shorter-than-normal cervix. Have gone into preterm labor before. Have had surgery on their cervix. Are younger than age 17  or older than age 35. Are African American. Are pregnant with twins or multiple babies (multiple gestation). Take street drugs or smoke while pregnant. Do not gain enough weight while pregnant. Became pregnant shortly after having been pregnant. What are the symptoms of preterm labor? Symptoms of preterm labor include: Cramps similar to those that can happen during a menstrual period. The cramps may happen with diarrhea. Pain in the abdomen or lower back. Regular uterine contractions that may feel like tightening of the abdomen. A feeling of increased pressure in the pelvis. Increased watery or bloody mucus discharge from the vagina. Water breaking (ruptured amniotic sac). Why is it important to recognize signs of preterm labor? It is important to recognize signs of preterm labor because babies who are born prematurely may not be fully developed. This can put them at an increased risk for: Long-term (chronic) heart and lung problems. Difficulty immediately after birth with regulating body systems, including blood sugar, body temperature, heart rate, and breathing rate. Bleeding in the brain. Cerebral palsy. Learning difficulties. Death. These risks are highest for babies who are born before 34 weeks   of pregnancy. How is preterm labor treated? Treatment depends on the length of your pregnancy, your condition, and the health of your baby. It may involve: Having a stitch (suture) placed in your cervix to prevent your cervix from opening too early (cerclage). Taking or being given medicines, such as: Hormone medicines. These may be given early in pregnancy to help support the pregnancy. Medicine to stop contractions. Medicines to help mature the baby's lungs. These may be prescribed if the risk of delivery is high. Medicines to prevent your baby from developing cerebral palsy. If the labor happens before 34 weeks of pregnancy, you may need to stay in the hospital. What should I do if I  think I am in preterm labor? If you think that you are going into preterm labor, call your health care provider right away. How can I prevent preterm labor in future pregnancies? To increase your chance of having a full-term pregnancy: Do not use any tobacco products, such as cigarettes, chewing tobacco, and e-cigarettes. If you need help quitting, ask your health care provider. Do not use street drugs or medicines that have not been prescribed to you during your pregnancy. Talk with your health care provider before taking any herbal supplements, even if you have been taking them regularly. Make sure you gain a healthy amount of weight during your pregnancy. Watch for infection. If you think that you might have an infection, get it checked right away. Make sure to tell your health care provider if you have gone into preterm labor before. This information is not intended to replace advice given to you by your health care provider. Make sure you discuss any questions you have with your health care provider. Document Revised: 10/15/2018 Document Reviewed: 11/14/2015 Elsevier Patient Education  2020 Elsevier Inc.   

## 2021-01-01 ENCOUNTER — Other Ambulatory Visit: Payer: BC Managed Care – PPO

## 2021-01-01 ENCOUNTER — Encounter: Payer: BC Managed Care – PPO | Admitting: Women's Health

## 2021-01-03 ENCOUNTER — Ambulatory Visit (INDEPENDENT_AMBULATORY_CARE_PROVIDER_SITE_OTHER): Payer: BC Managed Care – PPO | Admitting: *Deleted

## 2021-01-03 ENCOUNTER — Other Ambulatory Visit: Payer: Self-pay

## 2021-01-03 VITALS — BP 135/80 | HR 88 | Wt 358.8 lb

## 2021-01-03 DIAGNOSIS — O99210 Obesity complicating pregnancy, unspecified trimester: Secondary | ICD-10-CM

## 2021-01-03 DIAGNOSIS — O10919 Unspecified pre-existing hypertension complicating pregnancy, unspecified trimester: Secondary | ICD-10-CM

## 2021-01-03 DIAGNOSIS — O0990 Supervision of high risk pregnancy, unspecified, unspecified trimester: Secondary | ICD-10-CM | POA: Diagnosis not present

## 2021-01-03 DIAGNOSIS — M7711 Lateral epicondylitis, right elbow: Secondary | ICD-10-CM

## 2021-01-03 DIAGNOSIS — G43809 Other migraine, not intractable, without status migrainosus: Secondary | ICD-10-CM

## 2021-01-03 DIAGNOSIS — Z3A37 37 weeks gestation of pregnancy: Secondary | ICD-10-CM | POA: Diagnosis not present

## 2021-01-03 NOTE — Progress Notes (Signed)
   NURSE VISIT- NST  SUBJECTIVE:  Mallory Atkins is a 30 y.o. G1P0000 female at [redacted]w[redacted]d, here for a NST for pregnancy complicated by [facility].  She reports active fetal movement, contractions: none, vaginal bleeding: none, membranes: intact.   OBJECTIVE:  BP 135/80   Pulse 88   Wt (!) 358 lb 12.8 oz (162.8 kg)   LMP 04/19/2020 (Exact Date)   BMI 65.63 kg/m   Appears well, no apparent distress  No results found for this or any previous visit (from the past 24 hour(s)).  NST: FHR baseline 140 bpm, Variability: moderate, Accelerations:present, Decelerations:  Absent= Cat 1/reactive Toco: none   ASSESSMENT: G1P0000 at [redacted]w[redacted]d with CHTN NST reactive  PLAN: EFM strip reviewed by Mallory Atkins, CNM   Recommendations: keep next appointment as scheduled    Jobe Marker  01/03/2021 5:13 PM

## 2021-01-04 ENCOUNTER — Other Ambulatory Visit: Payer: BC Managed Care – PPO

## 2021-01-08 ENCOUNTER — Ambulatory Visit (INDEPENDENT_AMBULATORY_CARE_PROVIDER_SITE_OTHER): Payer: BC Managed Care – PPO | Admitting: Women's Health

## 2021-01-08 ENCOUNTER — Other Ambulatory Visit: Payer: Self-pay

## 2021-01-08 ENCOUNTER — Encounter: Payer: Self-pay | Admitting: Women's Health

## 2021-01-08 ENCOUNTER — Ambulatory Visit (INDEPENDENT_AMBULATORY_CARE_PROVIDER_SITE_OTHER): Payer: BC Managed Care – PPO

## 2021-01-08 VITALS — BP 130/74 | HR 96 | Wt 365.2 lb

## 2021-01-08 DIAGNOSIS — O0993 Supervision of high risk pregnancy, unspecified, third trimester: Secondary | ICD-10-CM | POA: Diagnosis not present

## 2021-01-08 DIAGNOSIS — Z3A37 37 weeks gestation of pregnancy: Secondary | ICD-10-CM

## 2021-01-08 DIAGNOSIS — O10919 Unspecified pre-existing hypertension complicating pregnancy, unspecified trimester: Secondary | ICD-10-CM

## 2021-01-08 DIAGNOSIS — O0990 Supervision of high risk pregnancy, unspecified, unspecified trimester: Secondary | ICD-10-CM

## 2021-01-08 LAB — POCT URINALYSIS DIPSTICK OB
Blood, UA: NEGATIVE
Glucose, UA: NEGATIVE
Ketones, UA: NEGATIVE
Leukocytes, UA: NEGATIVE
Nitrite, UA: NEGATIVE
POC,PROTEIN,UA: NEGATIVE

## 2021-01-08 NOTE — Progress Notes (Signed)
HIGH-RISK PREGNANCY VISIT Patient name: Mallory Atkins MRN [id number]  Date of birth: [birth year] Chief Complaint:   Routine Prenatal Visit  History of Present Illness:   Mallory Atkins is a 30 y.o. G58P0000 female at [redacted]w[redacted]d with an Estimated Date of Delivery: 01/24/21 being seen today for ongoing management of a high-risk pregnancy complicated by chronic hypertension currently on labetalol 200mg  BID.    Today she reports  some headaches- apap helps ease. Had dizziness/blurred vision x1 recently, none since. Home bp's all normal/consistent w/ bp's we have been getting here. . Contractions: Irritability. Vag. Bleeding: None.  Movement: Present. denies leaking of fluid.   Depression screen [facility] 2/9 10/30/2020 08/03/2020 05/17/2020  Decreased Interest 0 0 0  Down, Depressed, Hopeless 0 0 0  PHQ - 2 Score 0 0 0  Altered sleeping 0 0 0  Tired, decreased energy 1 1 0  Change in appetite 0 0 0  Feeling bad or failure about yourself  0 0 0  Trouble concentrating 0 0 0  Moving slowly or fidgety/restless 0 0 0  Suicidal thoughts 0 0 0  PHQ-9 Score 1 1 0     GAD 7 : Generalized Anxiety Score 10/30/2020 08/03/2020 05/17/2020  Nervous, Anxious, on Edge 0 0 0  Control/stop worrying 0 0 0  Worry too much - different things 0 0 0  Trouble relaxing 0 0 0  Restless 0 0 0  Easily annoyed or irritable 0 0 0  Afraid - awful might happen 0 0 0  Total GAD 7 Score 0 0 0     Review of Systems:   Pertinent items are noted in HPI Denies abnormal vaginal discharge w/ itching/odor/irritation, headaches, visual changes, shortness of breath, chest pain, abdominal pain, severe nausea/vomiting, or problems with urination or bowel movements unless otherwise stated above. Pertinent History Reviewed:  Reviewed past medical,surgical, social, obstetrical and family history.  Reviewed problem list, medications and allergies. Physical Assessment:   Vitals:   01/08/21 1453  BP: 130/74  Pulse: 96  Weight: (!) 365 lb 3.2  oz (165.7 kg)  Body mass index is 66.8 kg/m.           Physical Examination:   General appearance: alert, well appearing, and in no distress  Mental status: alert, oriented to person, place, and time  Skin: warm & dry   Extremities: Edema: Trace    Cardiovascular: normal heart rate noted  Respiratory: normal respiratory effort, no distress  Abdomen: gravid, soft, non-tender  Pelvic: Cervical exam deferred         Fetal Status: Fetal Heart Rate (bpm): 146 u/s   Movement: Present    Fetal Surveillance Testing today:   03/11/21 37+5 wks,cephalic,BPP 8/8,anterior placenta gr 3,AFI 15 cm,RI .57,.55,.59,.54=66%,FHR 146 BPM    Chaperone: N/A    Results for orders placed or performed in visit on 01/08/21 (from the past 24 hour(s))  POC Urinalysis Dipstick OB   Collection Time: 01/08/21  3:10 PM  Result Value Ref Range   Color, UA     Clarity, UA     Glucose, UA Negative Negative   Bilirubin, UA     Ketones, UA neg    Spec Grav, UA     Blood, UA neg    pH, UA     POC,PROTEIN,UA Negative Negative, Trace, Small (1+), Moderate (2+), Large (3+), 4+   Urobilinogen, UA     Nitrite, UA neg    Leukocytes, UA Negative Negative   Appearance  Odor      Assessment & Plan:  High-risk pregnancy: G1P0000 at [redacted]w[redacted]d with an Estimated Date of Delivery: 01/24/21   1) CHTN, stable, on labetalol 200mg  BID, ASA. BPP 8/8, UAD normal. IOL scheduled for 7/14 @[place] .  IOL form faxed via Epic and orders placed. Reviewed pre-e s/s, reasons to seek care  Meds: No orders of the defined types were placed in this encounter.  Labs/procedures today: U/S  Treatment Plan:  2x/wk testing, IOL 2 39wks  Reviewed: Term labor symptoms and general obstetric precautions including but not limited to vaginal bleeding, contractions, leaking of fluid and fetal movement were reviewed in detail with the patient.  All questions were answered. Does have home bp cuff. Office bp cuff given: not applicable. Check bp twice daily, let  8/14 know if consistently >160 and/or >110.  Follow-up: Return for As scheduled.   Future Appointments  Date Time Provider Department Center  01/11/2021  9:30 AM [facility] NURSE [facility]  01/14/2021  8:30 AM [facility] 03/14/2021 [facility] None  01/14/2021  9:50 AM [place], MD [facility]  01/17/2021 12:00 AM [facility] SCHED ROOM [facility] None  01/17/2021  4:10 PM [facility] NURSE [facility]    Orders Placed This Encounter  Procedures   POC Urinalysis Dipstick OB   01/19/2021 CNM, [facility] 01/08/2021 3:36 PM

## 2021-01-08 NOTE — Progress Notes (Signed)
[place] 37+5 wks,cephalic,BPP 8/8,anterior placenta gr 3,AFI 15 cm,RI .57,.55,.59,.54=66%,FHR 146 BPM

## 2021-01-08 NOTE — Patient Instructions (Signed)
Mallory Atkins, thank you for choosing our office today! We appreciate the opportunity to meet your healthcare needs. You may receive a short survey by mail, e-mail, or through [employer]. If you are happy with your care we would appreciate if you could take just a few minutes to complete the survey questions. We read all of your comments and take your feedback very seriously. Thank you again for choosing our office.  Center for [employer] Team at [facility]  [facility] at [facility] ([address] [place], [place] [address]) Entrance C, located off of [address] Free 24/7 valet parking   CLASSES: Go to [email or web address] to register for classes (childbirth, breastfeeding, waterbirth, infant CPR, daddy bootcamp, etc.)  Call the office [phone]) or go to [facility] if: You begin to have strong, frequent contractions Your water breaks.  Sometimes it is a big gush of fluid, sometimes it is just a trickle that keeps getting your panties wet or running down your legs You have vaginal bleeding.  It is normal to have a small amount of spotting if your cervix was checked.  You don't feel your baby moving like normal.  If you don't, get you something to eat and drink and lay down and focus on feeling your baby move.   If your baby is still not moving like normal, you should call the office or go to [facility].  Call the office [phone]) or go to [facility] hospital for these signs of pre-eclampsia: Severe headache that does not go away with Tylenol Visual changes- seeing spots, double, blurred vision Pain under your right breast or upper abdomen that does not go away with Tums or heartburn medicine Nausea and/or vomiting Severe swelling in your hands, feet, and face   [facility] Pediatricians/Family Doctors [facility]): [address] Colette Ribas, [phone]           [facility]: [address], [phone]                 [facility]): [address], [phone] (call to ask if accepting patients) [facility] Department: [address], [place], [phone]    [facility] Pediatricians/Family Doctors [facility]): [address], [phone] [facility]: [address] [place], [phone] [facility]: [address], [phone]  [facility] Doctors  [facility]): [phone] [facility]: [address], [phone]   [facility] Doctors [facility]: [address] [phone]  [facility] Doctors  [facility]: [phone], [phone]  Home Blood Pressure Monitoring for Patients   Your provider has recommended that you check your blood pressure (BP) at least once a week at home. If you do not have a blood pressure cuff at home, one will be provided for you. Contact your provider if you have not received your monitor within 1 week.   Helpful Tips for Accurate Home Blood Pressure Checks  Don't smoke, exercise, or drink caffeine 30 minutes before checking your BP Use the restroom before checking your BP (a full bladder can raise your pressure) Relax in a comfortable upright chair Feet on the ground Left arm resting comfortably on a flat surface at the level of your heart Legs uncrossed Back supported Sit quietly and don't talk Place the cuff on your bare arm Adjust snuggly, so that only two fingertips  can fit between your skin and the top of the cuff Check 2 readings separated by at least one minute Keep a log of your BP readings For a visual, please reference this diagram: http://ccnc.care/bpdiagram  Provider Name: [facility]     Phone: [phone]  Zone 1: ALL CLEAR  Continue to monitor your symptoms:  BP reading is less than 140 (top number) or less than 90 (bottom number)  No right  upper stomach pain No headaches or seeing spots No feeling nauseated or throwing up No swelling in face and hands  Zone 2: CAUTION Call your doctor's office for any of the following:  BP reading is greater than 140 (top number) or greater than 90 (bottom number)  Stomach pain under your ribs in the middle or right side Headaches or seeing spots Feeling nauseated or throwing up Swelling in face and hands  Zone 3: EMERGENCY  Seek immediate medical care if you have any of the following:  BP reading is greater than160 (top number) or greater than 110 (bottom number) Severe headaches not improving with Tylenol Serious difficulty catching your breath Any worsening symptoms from Zone 2   Braxton Hicks Contractions Contractions of the uterus can occur throughout pregnancy, but they are not always a sign that you are in labor. You may have practice contractions called Braxton Hicks contractions. These false labor contractions are sometimes confused with true labor. What are [place] contractions? Braxton Hicks contractions are tightening movements that occur in the muscles of the uterus before labor. Unlike true labor contractions, these contractions do not result in opening (dilation) and thinning of the cervix. Toward the end of pregnancy (32-34 weeks), Braxton Hicks contractions can happen more often and may become stronger. These contractions are sometimes difficult to tell apart from true labor because they can be very uncomfortable. You should not feel embarrassed if you go to the hospital with false labor. Sometimes, the only way to tell if you are in true labor is for your health care provider to look for changes in the cervix. The health care provider will do a physical exam and may monitor your contractions. If you are not in true labor, the exam should show that your cervix is not dilating and your water has not broken. If there are no other health problems associated with your  pregnancy, it is completely safe for you to be sent home with false labor. You may continue to have Braxton Hicks contractions until you go into true labor. How to tell the difference between true labor and false labor True labor Contractions last 30-70 seconds. Contractions become very regular. Discomfort is usually felt in the top of the uterus, and it spreads to the lower abdomen and low back. Contractions do not go away with walking. Contractions usually become more intense and increase in frequency. The cervix dilates and gets thinner. False labor Contractions are usually shorter and not as strong as true labor contractions. Contractions are usually irregular. Contractions are often felt in the front of the lower abdomen and in the groin. Contractions may go away when you walk around or change positions while lying down. Contractions get weaker and are shorter-lasting as time goes on. The cervix usually does not dilate or become thin. Follow these instructions at home:  Take over-the-counter and prescription medicines only as told by your health care provider. Keep up with your usual exercises and follow other instructions from your health care provider. Eat and drink lightly if you think  you are going into labor. If Braxton Hicks contractions are making you uncomfortable: Change your position from lying down or resting to walking, or change from walking to resting. Sit and rest in a tub of warm water. Drink enough fluid to keep your urine pale yellow. Dehydration may cause these contractions. Do slow and deep breathing several times an hour. Keep all follow-up prenatal visits as told by your health care provider. This is important. Contact a health care provider if: You have a fever. You have continuous pain in your abdomen. Get help right away if: Your contractions become stronger, more regular, and closer together. You have fluid leaking or gushing from your vagina. You pass  blood-tinged mucus (bloody show). You have bleeding from your vagina. You have low back pain that you never had before. You feel your baby's head pushing down and causing pelvic pressure. Your baby is not moving inside you as much as it used to. Summary Contractions that occur before labor are called Braxton Hicks contractions, false labor, or practice contractions. Braxton Hicks contractions are usually shorter, weaker, farther apart, and less regular than true labor contractions. True labor contractions usually become progressively stronger and regular, and they become more frequent. Manage discomfort from [facility] contractions by changing position, resting in a warm bath, drinking plenty of water, or practicing deep breathing. This information is not intended to replace advice given to you by your health care provider. Make sure you discuss any questions you have with your health care provider. Document Revised: 06/05/2017 Document Reviewed: 11/06/2016 Elsevier Patient Education  [place].

## 2021-01-09 ENCOUNTER — Other Ambulatory Visit: Payer: Self-pay | Admitting: Advanced Practice Midwife

## 2021-01-10 ENCOUNTER — Other Ambulatory Visit: Payer: BC Managed Care – PPO

## 2021-01-11 ENCOUNTER — Other Ambulatory Visit: Payer: BC Managed Care – PPO

## 2021-01-11 ENCOUNTER — Other Ambulatory Visit: Payer: Self-pay

## 2021-01-11 ENCOUNTER — Ambulatory Visit (INDEPENDENT_AMBULATORY_CARE_PROVIDER_SITE_OTHER): Payer: BC Managed Care – PPO | Admitting: *Deleted

## 2021-01-11 VITALS — BP 122/85 | HR 88 | Wt 364.0 lb

## 2021-01-11 DIAGNOSIS — Z1389 Encounter for screening for other disorder: Secondary | ICD-10-CM

## 2021-01-11 DIAGNOSIS — Z331 Pregnant state, incidental: Secondary | ICD-10-CM

## 2021-01-11 DIAGNOSIS — O0990 Supervision of high risk pregnancy, unspecified, unspecified trimester: Secondary | ICD-10-CM

## 2021-01-11 DIAGNOSIS — O10919 Unspecified pre-existing hypertension complicating pregnancy, unspecified trimester: Secondary | ICD-10-CM | POA: Diagnosis not present

## 2021-01-11 DIAGNOSIS — O288 Other abnormal findings on antenatal screening of mother: Secondary | ICD-10-CM

## 2021-01-11 LAB — POCT URINALYSIS DIPSTICK OB
Blood, UA: NEGATIVE
Glucose, UA: NEGATIVE
Ketones, UA: NEGATIVE
Leukocytes, UA: NEGATIVE
Nitrite, UA: NEGATIVE
POC,PROTEIN,UA: NEGATIVE

## 2021-01-11 NOTE — Progress Notes (Addendum)
   NURSE VISIT- NST  SUBJECTIVE:  Mallory Atkins is a 30 y.o. G1P0000 female at [redacted]w[redacted]d, here for a NST for pregnancy complicated by [facility].  She reports active fetal movement, contractions: none, vaginal bleeding: none, membranes: intact.   OBJECTIVE:  BP 122/85   Pulse 88   Wt (!) 364 lb (165.1 kg)   LMP 04/19/2020 (Exact Date)   BMI 66.58 kg/m   Appears well, no apparent distress  Results for orders placed or performed in visit on 01/11/21 (from the past 24 hour(s))  POC Urinalysis Dipstick OB   Collection Time: 01/11/21 10:13 AM  Result Value Ref Range   Color, UA     Clarity, UA     Glucose, UA Negative Negative   Bilirubin, UA     Ketones, UA neg    Spec Grav, UA     Blood, UA neg    pH, UA     POC,PROTEIN,UA Negative Negative, Trace, Small (1+), Moderate (2+), Large (3+), 4+   Urobilinogen, UA     Nitrite, UA neg    Leukocytes, UA Negative Negative   Appearance     Odor      NST: FHR baseline 135 bpm, Variability: moderate, Accelerations:present, Decelerations:  Absent= Cat 1/reactive Toco: none   ASSESSMENT: G1P0000 at [redacted]w[redacted]d with CHTN NST reactive  PLAN: EFM strip reviewed by [name], CNM, [facility]   Recommendations: keep next appointment as scheduled    Jobe Marker  01/11/2021 1:38 PM  Chart reviewed for nurse visit. Agree with plan of care.  Cheral Marker, [place] 01/11/2021 2:44 PM

## 2021-01-14 ENCOUNTER — Encounter: Payer: Self-pay | Admitting: Obstetrics & Gynecology

## 2021-01-14 ENCOUNTER — Ambulatory Visit (INDEPENDENT_AMBULATORY_CARE_PROVIDER_SITE_OTHER): Payer: BC Managed Care – PPO | Admitting: Obstetrics & Gynecology

## 2021-01-14 ENCOUNTER — Telehealth (HOSPITAL_COMMUNITY): Payer: Self-pay | Admitting: *Deleted

## 2021-01-14 ENCOUNTER — Other Ambulatory Visit: Payer: Self-pay

## 2021-01-14 ENCOUNTER — Encounter (HOSPITAL_COMMUNITY): Payer: Self-pay | Admitting: *Deleted

## 2021-01-14 ENCOUNTER — Ambulatory Visit (INDEPENDENT_AMBULATORY_CARE_PROVIDER_SITE_OTHER): Payer: BC Managed Care – PPO

## 2021-01-14 VITALS — BP 121/73 | HR 82 | Wt 366.0 lb

## 2021-01-14 DIAGNOSIS — O99210 Obesity complicating pregnancy, unspecified trimester: Secondary | ICD-10-CM

## 2021-01-14 DIAGNOSIS — O0993 Supervision of high risk pregnancy, unspecified, third trimester: Secondary | ICD-10-CM

## 2021-01-14 DIAGNOSIS — Z3A38 38 weeks gestation of pregnancy: Secondary | ICD-10-CM

## 2021-01-14 DIAGNOSIS — O10919 Unspecified pre-existing hypertension complicating pregnancy, unspecified trimester: Secondary | ICD-10-CM | POA: Diagnosis not present

## 2021-01-14 DIAGNOSIS — O0990 Supervision of high risk pregnancy, unspecified, unspecified trimester: Secondary | ICD-10-CM

## 2021-01-14 LAB — POCT URINALYSIS DIPSTICK OB
Blood, UA: NEGATIVE
Glucose, UA: NEGATIVE
Ketones, UA: NEGATIVE
Leukocytes, UA: NEGATIVE
Nitrite, UA: NEGATIVE
POC,PROTEIN,UA: NEGATIVE

## 2021-01-14 NOTE — Addendum Note (Signed)
Addended by: Malachy Mood S on: 01/14/2021 12:30 PM   Modules accepted: Orders

## 2021-01-14 NOTE — Progress Notes (Signed)
[place] 38+4 wks,cephalic,BPP 8/8,RI .50,.58,.56,.58=63%,AFI 8.3 cm,FHR 137 bpm

## 2021-01-14 NOTE — Progress Notes (Signed)
HIGH-RISK PREGNANCY VISIT Patient name: Mallory Atkins MRN [id number]  Date of birth: [birth year] Chief Complaint:   Routine Prenatal Visit (U/S today)  History of Present Illness:   Jilleen Essner is a 30 y.o. G45P0000 female at [redacted]w[redacted]d with an Estimated Date of Delivery: 01/24/21 being seen today for ongoing management of a high-risk pregnancy complicated by chronic hypertension currently on labetalol 200 BID and ASA 81 mg.    Today she reports no complaints. Contractions: Irritability.  .  Movement: Present. denies leaking of fluid.   Depression screen [facility] 2/9 10/30/2020 08/03/2020 05/17/2020  Decreased Interest 0 0 0  Down, Depressed, Hopeless 0 0 0  PHQ - 2 Score 0 0 0  Altered sleeping 0 0 0  Tired, decreased energy 1 1 0  Change in appetite 0 0 0  Feeling bad or failure about yourself  0 0 0  Trouble concentrating 0 0 0  Moving slowly or fidgety/restless 0 0 0  Suicidal thoughts 0 0 0  PHQ-9 Score 1 1 0     GAD 7 : Generalized Anxiety Score 10/30/2020 08/03/2020 05/17/2020  Nervous, Anxious, on Edge 0 0 0  Control/stop worrying 0 0 0  Worry too much - different things 0 0 0  Trouble relaxing 0 0 0  Restless 0 0 0  Easily annoyed or irritable 0 0 0  Afraid - awful might happen 0 0 0  Total GAD 7 Score 0 0 0     Review of Systems:   Pertinent items are noted in HPI Denies abnormal vaginal discharge w/ itching/odor/irritation, headaches, visual changes, shortness of breath, chest pain, abdominal pain, severe nausea/vomiting, or problems with urination or bowel movements unless otherwise stated above. Pertinent History Reviewed:  Reviewed past medical,surgical, social, obstetrical and family history.  Reviewed problem list, medications and allergies. Physical Assessment:   Vitals:   01/14/21 0935  BP: 121/73  Pulse: 82  Weight: (!) 366 lb (166 kg)  Body mass index is 66.94 kg/m.           Physical Examination:   General appearance: alert, well appearing, and in no  distress  Mental status: alert, oriented to person, place, and time  Skin: warm & dry   Extremities: Edema: Trace    Cardiovascular: normal heart rate noted  Respiratory: normal respiratory effort, no distress  Abdomen: gravid, soft, non-tender  Pelvic: Cervical exam deferred         Fetal Status:     Movement: Present    Fetal Surveillance Testing today: BPP 8/8 with normal Dopplers   Chaperone: N/A    No results found for this or any previous visit (from the past 24 hour(s)).  Assessment & Plan:  High-risk pregnancy: G1P0000 at [redacted]w[redacted]d with an Estimated Date of Delivery: 01/24/21   1) CHTN on labetalol 200 BID + ASA, reassuring Fetal surveillance, EFW 85%, which extrpolates to 3910 grams at 39 weeks  2) BMI 66,   Meds: No orders of the defined types were placed in this encounter.   Labs/procedures today: U/S  Treatment Plan:  IOL 01/17/21 [place]  Reviewed: Term labor symptoms and general obstetric precautions including but not limited to vaginal bleeding, contractions, leaking of fluid and fetal movement were reviewed in detail with the patient.  All questions were answered. Does have home bp cuff. Office bp cuff given: not applicable. Check bp daily, let us know if consistently >140 and/or >90.  Follow-up: No follow-ups on file.   Future Appointments  Date Time  Provider Department Center  01/17/2021 12:00 AM [facility] SCHED ROOM [facility] None  01/17/2021  4:10 PM [facility] NURSE [facility]    No orders of the defined types were placed in this encounter.  Lazaro Arms  01/14/2021 10:01 AM

## 2021-01-14 NOTE — Telephone Encounter (Signed)
Preadmission screen  

## 2021-01-15 ENCOUNTER — Other Ambulatory Visit (HOSPITAL_COMMUNITY): Payer: BC Managed Care – PPO

## 2021-01-17 ENCOUNTER — Other Ambulatory Visit: Payer: Self-pay

## 2021-01-17 ENCOUNTER — Encounter (HOSPITAL_COMMUNITY): Payer: Self-pay | Admitting: Obstetrics & Gynecology

## 2021-01-17 ENCOUNTER — Other Ambulatory Visit: Payer: BC Managed Care – PPO

## 2021-01-17 ENCOUNTER — Inpatient Hospital Stay (HOSPITAL_COMMUNITY)
Admission: AD | Admit: 2021-01-17 | Discharge: 2021-01-20 | DRG: 768 | Disposition: A | Discharge status: Home / Self Care | Payer: BC Managed Care – PPO | Attending: Obstetrics and Gynecology | Admitting: Obstetrics and Gynecology

## 2021-01-17 ENCOUNTER — Inpatient Hospital Stay (HOSPITAL_COMMUNITY): Payer: BC Managed Care – PPO

## 2021-01-17 DIAGNOSIS — Z6791 Unspecified blood type, Rh negative: Secondary | ICD-10-CM | POA: Diagnosis not present

## 2021-01-17 DIAGNOSIS — O1002 Pre-existing essential hypertension complicating childbirth: Secondary | ICD-10-CM | POA: Diagnosis not present

## 2021-01-17 DIAGNOSIS — O99214 Obesity complicating childbirth: Secondary | ICD-10-CM | POA: Diagnosis not present

## 2021-01-17 DIAGNOSIS — B001 Herpesviral vesicular dermatitis: Secondary | ICD-10-CM | POA: Diagnosis not present

## 2021-01-17 DIAGNOSIS — O10919 Unspecified pre-existing hypertension complicating pregnancy, unspecified trimester: Secondary | ICD-10-CM | POA: Diagnosis present

## 2021-01-17 DIAGNOSIS — D62 Acute posthemorrhagic anemia: Secondary | ICD-10-CM | POA: Diagnosis not present

## 2021-01-17 DIAGNOSIS — Z7982 Long term (current) use of aspirin: Secondary | ICD-10-CM

## 2021-01-17 DIAGNOSIS — O9852 Other viral diseases complicating childbirth: Secondary | ICD-10-CM | POA: Diagnosis not present

## 2021-01-17 DIAGNOSIS — O9081 Anemia of the puerperium: Secondary | ICD-10-CM | POA: Diagnosis not present

## 2021-01-17 DIAGNOSIS — O26893 Other specified pregnancy related conditions, third trimester: Secondary | ICD-10-CM | POA: Diagnosis present

## 2021-01-17 DIAGNOSIS — Z3A39 39 weeks gestation of pregnancy: Secondary | ICD-10-CM | POA: Diagnosis not present

## 2021-01-17 DIAGNOSIS — Z8759 Personal history of other complications of pregnancy, childbirth and the puerperium: Secondary | ICD-10-CM | POA: Diagnosis not present

## 2021-01-17 DIAGNOSIS — O99210 Obesity complicating pregnancy, unspecified trimester: Secondary | ICD-10-CM | POA: Diagnosis present

## 2021-01-17 DIAGNOSIS — Z20822 Contact with and (suspected) exposure to covid-19: Secondary | ICD-10-CM | POA: Diagnosis not present

## 2021-01-17 DIAGNOSIS — O10913 Unspecified pre-existing hypertension complicating pregnancy, third trimester: Secondary | ICD-10-CM | POA: Diagnosis not present

## 2021-01-17 DIAGNOSIS — I10 Essential (primary) hypertension: Secondary | ICD-10-CM | POA: Diagnosis present

## 2021-01-17 DIAGNOSIS — Q381 Ankyloglossia: Secondary | ICD-10-CM | POA: Diagnosis not present

## 2021-01-17 DIAGNOSIS — Z23 Encounter for immunization: Secondary | ICD-10-CM | POA: Diagnosis not present

## 2021-01-17 DIAGNOSIS — O26899 Other specified pregnancy related conditions, unspecified trimester: Secondary | ICD-10-CM

## 2021-01-17 LAB — PROTEIN / CREATININE RATIO, URINE
Creatinine, Urine: 235.35 mg/dL
Protein Creatinine Ratio: 0.06 mg/mg{Cre} (ref 0.00–0.15)
Total Protein, Urine: 14 mg/dL

## 2021-01-17 LAB — CBC
HCT: 30.3 % — ABNORMAL LOW (ref 36.0–46.0)
Hemoglobin: 10.2 g/dL — ABNORMAL LOW (ref 12.0–15.0)
MCH: 30.5 pg (ref 26.0–34.0)
MCHC: 33.7 g/dL (ref 30.0–36.0)
MCV: 90.7 fL (ref 80.0–100.0)
Platelets: 214 10*3/uL (ref 150–400)
RBC: 3.34 MIL/uL — ABNORMAL LOW (ref 3.87–5.11)
RDW: 14.1 % (ref 11.5–15.5)
WBC: 13.1 10*3/uL — ABNORMAL HIGH (ref 4.0–10.5)
nRBC: 0 % (ref 0.0–0.2)

## 2021-01-17 LAB — RESP PANEL BY RT-PCR (FLU A&B, COVID) ARPGX2
Influenza A by PCR: NEGATIVE
Influenza B by PCR: NEGATIVE
SARS Coronavirus 2 by RT PCR: NEGATIVE

## 2021-01-17 LAB — COMPREHENSIVE METABOLIC PANEL
ALT: 12 U/L (ref 0–44)
AST: 16 U/L (ref 15–41)
Albumin: 2.5 g/dL — ABNORMAL LOW (ref 3.5–5.0)
Alkaline Phosphatase: 103 U/L (ref 38–126)
Anion gap: 9 (ref 5–15)
BUN: 8 mg/dL (ref 6–20)
CO2: 19 mmol/L — ABNORMAL LOW (ref 22–32)
Calcium: 8.6 mg/dL — ABNORMAL LOW (ref 8.9–10.3)
Chloride: 107 mmol/L (ref 98–111)
Creatinine, Ser: 0.61 mg/dL (ref 0.44–1.00)
GFR, Estimated: >60 mL/min (ref >60)
Glucose, Bld: 139 mg/dL — ABNORMAL HIGH (ref 70–99)
Potassium: 3.7 mmol/L (ref 3.5–5.1)
Sodium: 135 mmol/L (ref 135–145)
Total Bilirubin: 0.3 mg/dL (ref 0.3–1.2)
Total Protein: 5.9 g/dL — ABNORMAL LOW (ref 6.5–8.1)

## 2021-01-17 LAB — RPR: RPR Ser Ql: NONREACTIVE

## 2021-01-17 LAB — TYPE AND SCREEN
ABO/RH(D): A NEG
Antibody Screen: NEGATIVE

## 2021-01-17 MED ORDER — OXYTOCIN BOLUS FROM INFUSION
333.0000 mL | Freq: Once | INTRAVENOUS | Status: AC
  Administered 2021-01-18: 333 mL via INTRAVENOUS

## 2021-01-17 MED ORDER — ACETAMINOPHEN 325 MG PO TABS: 650.0000 mg | ORAL_TABLET | ORAL | Status: DC | PRN

## 2021-01-17 MED ORDER — TERBUTALINE SULFATE 1 MG/ML IJ SOLN
0.2500 mg | Freq: Once | INTRAMUSCULAR | Status: DC | PRN
  Filled 2021-01-17: qty 1

## 2021-01-17 MED ORDER — MISOPROSTOL 50MCG HALF TABLET
ORAL_TABLET | ORAL | Status: AC
  Filled 2021-01-17: qty 1

## 2021-01-17 MED ORDER — LABETALOL HCL 5 MG/ML IV SOLN
40.0000 mg | INTRAVENOUS | Status: DC | PRN
Start: 2021-01-17 — End: 2021-01-19

## 2021-01-17 MED ORDER — LABETALOL HCL 5 MG/ML IV SOLN: 20.0000 mg | INTRAVENOUS | Status: DC | PRN

## 2021-01-17 MED ORDER — OXYCODONE-ACETAMINOPHEN 5-325 MG PO TABS: 2.0000 | ORAL_TABLET | ORAL | Status: DC | PRN

## 2021-01-17 MED ORDER — LABETALOL HCL 5 MG/ML IV SOLN: 80.0000 mg | INTRAVENOUS | Status: DC | PRN

## 2021-01-17 MED ORDER — LABETALOL HCL 200 MG PO TABS
200.0000 mg | ORAL_TABLET | Freq: Two times a day (BID) | ORAL | Status: DC
  Administered 2021-01-17 – 2021-01-18 (×3): 200 mg via ORAL
  Filled 2021-01-17 (×3): qty 1

## 2021-01-17 MED ORDER — MISOPROSTOL 50MCG HALF TABLET
50.0000 ug | ORAL_TABLET | ORAL | Status: DC | PRN
  Administered 2021-01-17: 50 ug via BUCCAL

## 2021-01-17 MED ORDER — HYDROXYZINE HCL 50 MG PO TABS: 50.0000 mg | ORAL_TABLET | Freq: Four times a day (QID) | ORAL | Status: DC | PRN

## 2021-01-17 MED ORDER — OXYCODONE-ACETAMINOPHEN 5-325 MG PO TABS: 1.0000 | ORAL_TABLET | ORAL | Status: DC | PRN

## 2021-01-17 MED ORDER — LACTATED RINGERS IV SOLN: 500.0000 mL | INTRAVENOUS | Status: DC | PRN

## 2021-01-17 MED ORDER — FENTANYL CITRATE (PF) 100 MCG/2ML IJ SOLN
100.0000 ug | INTRAMUSCULAR | Status: DC | PRN
  Administered 2021-01-17: 100 ug via INTRAVENOUS
  Filled 2021-01-17: qty 2

## 2021-01-17 MED ORDER — ONDANSETRON HCL 4 MG/2ML IJ SOLN
4.0000 mg | Freq: Four times a day (QID) | INTRAMUSCULAR | Status: DC | PRN
  Administered 2021-01-18: 4 mg via INTRAVENOUS
  Filled 2021-01-17: qty 2

## 2021-01-17 MED ORDER — LACTATED RINGERS IV SOLN: INTRAVENOUS | Status: DC

## 2021-01-17 MED ORDER — LIDOCAINE HCL (PF) 1 % IJ SOLN: 30.0000 mL | INTRAMUSCULAR | Status: DC | PRN

## 2021-01-17 MED ORDER — OXYTOCIN-SODIUM CHLORIDE 30-0.9 UT/500ML-% IV SOLN
1.0000 m[IU]/min | INTRAVENOUS | Status: DC
  Administered 2021-01-17: 2 m[IU]/min via INTRAVENOUS
  Filled 2021-01-17 (×2): qty 500

## 2021-01-17 MED ORDER — TERBUTALINE SULFATE 1 MG/ML IJ SOLN: 0.2500 mg | Freq: Once | INTRAMUSCULAR | Status: DC | PRN

## 2021-01-17 MED ORDER — FLEET ENEMA 7-19 GM/118ML RE ENEM: 1.0000 | ENEMA | RECTAL | Status: DC | PRN

## 2021-01-17 MED ORDER — MISOPROSTOL 25 MCG QUARTER TABLET
25.0000 ug | ORAL_TABLET | ORAL | Status: DC | PRN
  Administered 2021-01-17 (×2): 25 ug via VAGINAL
  Filled 2021-01-17 (×2): qty 1

## 2021-01-17 MED ORDER — SOD CITRATE-CITRIC ACID 500-334 MG/5ML PO SOLN
30.0000 mL | ORAL | Status: DC | PRN
  Administered 2021-01-18: 30 mL via ORAL
  Filled 2021-01-17: qty 30

## 2021-01-17 MED ORDER — OXYTOCIN-SODIUM CHLORIDE 30-0.9 UT/500ML-% IV SOLN: 2.5000 [IU]/h | INTRAVENOUS | Status: DC

## 2021-01-17 MED ORDER — LIDOCAINE 4 % EX CREA
TOPICAL_CREAM | Freq: Four times a day (QID) | CUTANEOUS | Status: DC | PRN
  Administered 2021-01-18: 1 via TOPICAL
  Filled 2021-01-17 (×2): qty 5

## 2021-01-17 MED ORDER — HYDRALAZINE HCL 20 MG/ML IJ SOLN: 10.0000 mg | INTRAMUSCULAR | Status: DC | PRN

## 2021-01-17 NOTE — H&P (Signed)
Mallory Atkins is a 30 y.o. G1P0000 female at [redacted]w[redacted]d by LMP c/w 8wk u/s presenting for IOL due to [facility].   Reports active fetal movement, contractions: none, vaginal bleeding: none, membranes: intact.  Initiated prenatal care at [facility] at 15 wks.   Most recent u/s: 36+4 wks,cephalic,BPP 8/8,RI.51,.51,.59=40%,EFW 3343 g 85%,anterior placenta gr 3,AFI 10.9 cm,fhr 160 BPM.   This pregnancy complicated by: - cHTN - obesity (BMI 67 on admit) - Rh neg (Rhogam 11/05/20) - frequent fever blisters (acyclovir suppression)  Prenatal History/Complications:  G1  Past Medical History: Past Medical History:  Diagnosis Date   Hidradenitis suppurativa    treated with remicade infusions every 6 weeks   Hypertension    Pre eclampsia   Migraines     Past Surgical History: Past Surgical History:  Procedure Laterality Date   CHOLECYSTECTOMY  2009   hidradentitis exicision      Obstetrical History: OB History     Gravida  1   Para  0   Term  0   Preterm  0   AB  0   Living  0      SAB  0   IAB  0   Ectopic  0   Multiple  0   Live Births  0           Social History: Social History   Socioeconomic History   Marital status: Married    Spouse name: Not on file   Number of children: Not on file   Years of education: Not on file   Highest education level: Not on file  Occupational History   Not on file  Tobacco Use   Smoking status: Never   Smokeless tobacco: Never  Vaping Use   Vaping Use: Not on file  Substance and Sexual Activity   Alcohol use: Not Currently    Comment: 6 months ago   Drug use: Not Currently    Types: Marijuana   Sexual activity: Yes    Birth control/protection: None  Other Topics Concern   Not on file  Social History Narrative   Not on file   Social Determinants of Health   Financial Resource Strain: Low Risk    Difficulty of Paying Living Expenses: Not hard at all  Food Insecurity: No Food Insecurity   Worried About Brewing technologist in the Last Year: Never true   Ran Out of Food in the Last Year: Never true  Transportation Needs: No Transportation Needs   Lack of Transportation (Medical): No   Lack of Transportation (Non-Medical): No  Physical Activity: Inactive   Days of Exercise per Week: 0 days   Minutes of Exercise per Session: 0 min  Stress: No Stress Concern Present   Feeling of Stress : Not at all  Social Connections: Moderately Integrated   Frequency of Communication with Friends and Family: More than three times a week   Frequency of Social Gatherings with Friends and Family: Three times a week   Attends Religious Services: 1 to 4 times per year   Active Member of Clubs or Organizations: No   Attends Banker Meetings: Never   Marital Status: Married    Family History: Family History  Problem Relation Age of Onset   Hypertension Maternal Grandmother    Skin cancer Maternal Grandfather    Hypertension Maternal Grandfather    Hypertension Mother    Ovarian cysts Mother    Fibroids Mother    Diabetes Maternal Aunt  Epilepsy Maternal Aunt    Stroke Maternal Aunt    COPD Maternal Aunt    Diabetes Maternal Aunt    Asthma Maternal Aunt     Allergies: No Known Allergies  Medications Prior to Admission  Medication Sig Dispense Refill Last Dose   acetaminophen (TYLENOL) 500 MG tablet Take by mouth.   Past Week   aspirin EC 81 MG tablet Take 81 mg by mouth daily. Swallow whole.   01/17/2021   Calcium Carbonate Antacid (TUMS PO) Take by mouth.   01/17/2021   famotidine (PEPCID) 10 MG tablet Take 20 mg by mouth 2 (two) times daily.   01/16/2021   labetalol (NORMODYNE) 200 MG tablet Take 1 tablet (200 mg total) by mouth 2 (two) times daily. 60 tablet 3 01/16/2021   Prenatal MV & Min w/FA-DHA (PRENATAL GUMMIES) 0.18-25 MG CHEW Chew 1 tablet by mouth daily. 30 tablet 2 01/16/2021   Vitamin D, Ergocalciferol, (DRISDOL) 1.25 MG (50000 UNIT) CAPS capsule Take 50,000 Units by mouth once a week.    01/16/2021   acyclovir (ZOVIRAX) 400 MG tablet Take 1 tablet (400 mg total) by mouth 3 (three) times daily. (Patient not taking: Reported on 01/14/2021) 90 tablet 3    ondansetron (ZOFRAN) 4 MG tablet Take 1 tablet (4 mg total) by mouth every 8 (eight) hours as needed for nausea or vomiting. (Patient not taking: No sig reported) 20 tablet 0     Review of Systems  Pertinent pos/neg as indicated in HPI  Temperature 97.9 F (36.6 C), temperature source Oral, height 5\' 2"  (1.575 m), weight (!) 167.9 kg, last menstrual period 04/19/2020. General appearance: alert, cooperative, and no distress Lungs: clear to auscultation bilaterally Heart: regular rate and rhythm Abdomen: gravid, soft, non-tender, EFW by Leopold's approximately 7-8lbs Extremities: 2+ edema DTR's nl  Fetal monitoring: FHR: 130s bpm, variability: moderate,  Accelerations: Present,  decelerations:  Absent Uterine activity: irreg, mild   Presentation: cephalic   Prenatal labs: ABO, Rh: --/--/A NEG (07/14 0035) Antibody: NEG (07/14 0035) Rubella: 2.89 (02/28 1614) RPR: Non Reactive (04/26 0846)  HBsAg: Negative (02/28 1614)  HIV: Non Reactive (04/26 0846)  GBS: Negative/-- (06/20 1330)  2hr GTT: 82/143/116  Prenatal Transfer Tool  Maternal Diabetes: No Genetic Screening: Normal Maternal Ultrasounds/Referrals: Normal Fetal Ultrasounds or other Referrals:  None Maternal Substance Abuse:  No Significant Maternal Medications:  Meds include: Other: Labetalol 200mg  bid Significant Maternal Lab Results: Group B Strep negative  Results for orders placed or performed during the hospital encounter of 01/17/21 (from the past 24 hour(s))  Type and screen   Collection Time: 01/17/21 12:35 AM  Result Value Ref Range   ABO/RH(D) A NEG    Antibody Screen NEG    Sample Expiration      01/20/2021,2359 Performed at [facility], [address], [place], [address] [place]   CBC   Collection Time: 01/17/21 12:57 AM   Result Value Ref Range   WBC 13.1 (H) 4.0 - 10.5 K/uL   RBC 3.34 (L) 3.87 - 5.11 MIL/uL   Hemoglobin 10.2 (L) 12.0 - 15.0 g/dL   HCT 09983 (L) 01/19/21 - 38.2 %   MCV 90.7 80.0 - 100.0 fL   MCH 30.5 26.0 - 34.0 pg   MCHC 33.7 30.0 - 36.0 g/dL   RDW 50.5 39.7 - 67.3 %   Platelets 214 150 - 400 K/uL   nRBC 0.0 0.0 - 0.2 %  Comprehensive metabolic panel   Collection Time: 01/17/21 12:57 AM  Result Value Ref Range   Sodium 135 135 - 145 mmol/L   Potassium 3.7 3.5 - 5.1 mmol/L   Chloride 107 98 - 111 mmol/L   CO2 19 (L) 22 - 32 mmol/L   Glucose, Bld 139 (H) 70 - 99 mg/dL   BUN 8 6 - 20 mg/dL   Creatinine, Ser 4.28 0.44 - 1.00 mg/dL   Calcium 8.6 (L) 8.9 - 10.3 mg/dL   Total Protein 5.9 (L) 6.5 - 8.1 g/dL   Albumin 2.5 (L) 3.5 - 5.0 g/dL   AST 16 15 - 41 U/L   ALT 12 0 - 44 U/L   Alkaline Phosphatase 103 38 - 126 U/L   Total Bilirubin 0.3 0.3 - 1.2 mg/dL   GFR, Estimated >76 >81 mL/min   Anion gap 9 5 - 15     Assessment:  [redacted]w[redacted]d SIUP  G1P0000  cHTN on Labetalol (neg labs)  Cat 1 FHR  GBS Negative/-- (06/20 1330)  Plan:  Admit to L&D  IV pain meds/epidural prn active labor  Plan cervical ripening first w cytotec/cervical foley, then Pit/AROM  Anticipate vag del   Plans to breastfeed  Contraception: condoms    Arabella Merles CNM 01/17/2021, 3:40 AM

## 2021-01-17 NOTE — Progress Notes (Signed)
Patient Vitals for the past 4 hrs:  BP Temp Temp src Pulse Resp  01/17/21 2301 126/76 98 F (36.7 C) Oral 75 --  01/17/21 2130 138/83 -- -- 78 17  01/17/21 2100 127/76 -- -- 75 18  01/17/21 2030 (!) 124/58 -- -- 76 17   FHR Cat 1.  Irregular and mild ctx, pt feeling them as "crampy": . Pitocin at 12 mu/min.  Will continue to titrate up until labor ensues, max 30 mu/min.

## 2021-01-17 NOTE — Progress Notes (Addendum)
Labor Progress Note Mallory Atkins is a 30 y.o. G1P0000 at [redacted]w[redacted]d presented for IOL cHTN S: Doing well. Feeling better with CC out.   O:  BP (!) 143/72   Pulse 83   Temp 97.7 F (36.5 C) (Mallory)   Resp 17   Ht 5\' 2"  (1.575 m)   Wt (!) 167.9 kg   LMP 04/19/2020 (Exact Date)   BMI 67.71 kg/m  EFM: baseline 130, mod variability, accels present, no variables, no decels TOCO: q1-7 min irregular   CVE: Dilation: 4 Effacement (%): 50 Station: -2 Presentation: Vertex Exam by:: Dr [email or web address]  A&P: 30 y.o. G1P0000 [redacted]w[redacted]d IOL cHTN #IOL - progressing well  --cyto x3 @ 1045 --CC @ [phone] --start Pit (2x2)  #cHTN --Labetalol 200mg  BID (held earlier, restarting now for intermittent mild range pressures)  #BMI 67 #Rh neg  #Pain: IV/epidural #FWB: Cat I  #GBS negative #MOF: breast #MOC: condoms   [id number] MD PGY3 3:37 PM  Attestation of Supervision of Student:  I confirm that I have verified the information documented in the  resident  student's note and that I have also personally reperformed the history, physical exam and all medical decision making activities.  I have verified that all services and findings are accurately documented in this student's note; and I agree with management and plan as outlined in the documentation. I have also made any necessary editorial changes.   , CNM Center for Mallory Atkins, [facility] 01/17/2021 6:33 PM

## 2021-01-17 NOTE — Progress Notes (Addendum)
Labor Progress Note Mallory Atkins is a 30 y.o. G1P0000 at [redacted]w[redacted]d presented for IOL cHTN S: Doing well. Discussed FB. Agreed   O:  BP (!) 109/41   Pulse 82   Temp 97.7 F (36.5 C) (Oral)   Resp 18   Ht 5\' 2"  (1.575 m)   Wt (!) 167.9 kg   LMP 04/19/2020 (Exact Date)   BMI 67.71 kg/m  EFM: baseline 130, mod variability, accels present, no variables, no decels TOCO: q2-8 min irregular   CVE:  2/50/-3  A&P: 30 y.o. G1P0000 [redacted]w[redacted]d IOL cHTN #IOL  --Cyto x2 --CC @ 0935 placed without difficulty 80/80 cc --cyto x3 @ 1045  #cHTN --Labetalol 200mg  BID --pressures wnl so far   #BMI 67 #Rh neg  #Pain: IV/epidural #FWB: Cat I  #GBS negative #MOF: breast #MOC: condoms   [redacted]w[redacted]d MD PGY3 9:41 AM   Attestation of Supervision of Student:  I confirm that I have verified the information documented in the resident's student's note and that I have also personally reperformed the history, physical exam and all medical decision making activities.  I have verified that all services and findings are accurately documented in this student's note; and I agree with management and plan as outlined in the documentation. I have also made any necessary editorial changes.    , CNM [facility] Atkins, [facility] 01/17/2021 6:35 PM

## 2021-01-18 ENCOUNTER — Encounter (HOSPITAL_COMMUNITY)
Admission: AD | Disposition: A | Discharge status: Home / Self Care | Payer: Self-pay | Source: Home / Self Care | Attending: Obstetrics and Gynecology

## 2021-01-18 ENCOUNTER — Inpatient Hospital Stay (HOSPITAL_COMMUNITY): Payer: BC Managed Care – PPO | Admitting: Certified Registered"

## 2021-01-18 ENCOUNTER — Other Ambulatory Visit: Payer: BC Managed Care – PPO

## 2021-01-18 ENCOUNTER — Inpatient Hospital Stay (HOSPITAL_COMMUNITY): Payer: BC Managed Care – PPO | Admitting: Anesthesiology

## 2021-01-18 DIAGNOSIS — Z3A39 39 weeks gestation of pregnancy: Secondary | ICD-10-CM

## 2021-01-18 DIAGNOSIS — Z8759 Personal history of other complications of pregnancy, childbirth and the puerperium: Secondary | ICD-10-CM | POA: Diagnosis not present

## 2021-01-18 DIAGNOSIS — O10913 Unspecified pre-existing hypertension complicating pregnancy, third trimester: Secondary | ICD-10-CM

## 2021-01-18 HISTORY — PX: PERINEAL LACERATION REPAIR: SHX5389

## 2021-01-18 LAB — CBC
HCT: 29.8 % — ABNORMAL LOW (ref 36.0–46.0)
Hemoglobin: 10 g/dL — ABNORMAL LOW (ref 12.0–15.0)
MCH: 30.1 pg (ref 26.0–34.0)
MCHC: 33.6 g/dL (ref 30.0–36.0)
MCV: 89.8 fL (ref 80.0–100.0)
Platelets: 198 10*3/uL (ref 150–400)
RBC: 3.32 MIL/uL — ABNORMAL LOW (ref 3.87–5.11)
RDW: 14.2 % (ref 11.5–15.5)
WBC: 13.9 10*3/uL — ABNORMAL HIGH (ref 4.0–10.5)
nRBC: 0 % (ref 0.0–0.2)

## 2021-01-18 SURGERY — SUTURE REPAIR, LACERATION, PERINEUM
Anesthesia: Epidural

## 2021-01-18 MED ORDER — STERILE WATER FOR IRRIGATION IR SOLN
Status: DC | PRN
  Administered 2021-01-18: 1

## 2021-01-18 MED ORDER — CHLOROPROCAINE HCL (PF) 3 % IJ SOLN
INTRAMUSCULAR | Status: DC | PRN
  Administered 2021-01-18: 5 mL
  Administered 2021-01-18: 3 mL
  Administered 2021-01-18: 5 mL
  Administered 2021-01-18: 3 mL
  Administered 2021-01-18 (×2): 5 mL

## 2021-01-18 MED ORDER — FENTANYL-BUPIVACAINE-NACL 0.5-0.125-0.9 MG/250ML-% EP SOLN
12.0000 mL/h | EPIDURAL | Status: DC | PRN
  Administered 2021-01-18: 12 mL/h via EPIDURAL
  Filled 2021-01-18 (×2): qty 250

## 2021-01-18 MED ORDER — LIDOCAINE HCL (PF) 1 % IJ SOLN
INTRAMUSCULAR | Status: DC | PRN
  Administered 2021-01-18: 11 mL via EPIDURAL

## 2021-01-18 MED ORDER — LACTATED RINGERS AMNIOINFUSION
INTRAVENOUS | Status: DC
Start: 2021-01-18 — End: 2021-01-19

## 2021-01-18 MED ORDER — DEXTROSE 5 % IV SOLN
INTRAVENOUS | Status: DC | PRN
  Administered 2021-01-18: 3 g via INTRAVENOUS

## 2021-01-18 MED ORDER — LACTATED RINGERS IV SOLN: INTRAVENOUS | Status: DC | PRN

## 2021-01-18 MED ORDER — TRANEXAMIC ACID-NACL 1000-0.7 MG/100ML-% IV SOLN
INTRAVENOUS | Status: AC
  Administered 2021-01-18: 1000 mg
  Filled 2021-01-18: qty 100

## 2021-01-18 MED ORDER — DIPHENHYDRAMINE HCL 50 MG/ML IJ SOLN: 12.5000 mg | INTRAMUSCULAR | Status: DC | PRN

## 2021-01-18 MED ORDER — PHENYLEPHRINE HCL (PRESSORS) 10 MG/ML IV SOLN
INTRAVENOUS | Status: DC | PRN
  Administered 2021-01-18 (×3): 50 ug via INTRAVENOUS

## 2021-01-18 MED ORDER — TRANEXAMIC ACID-NACL 1000-0.7 MG/100ML-% IV SOLN: 1000.0000 mg | Freq: Once | INTRAVENOUS | Status: DC

## 2021-01-18 MED ORDER — FENTANYL CITRATE (PF) 100 MCG/2ML IJ SOLN
INTRAMUSCULAR | Status: DC | PRN
  Administered 2021-01-18: 100 ug via EPIDURAL

## 2021-01-18 MED ORDER — KETOROLAC TROMETHAMINE 30 MG/ML IJ SOLN
INTRAMUSCULAR | Status: DC | PRN
  Administered 2021-01-18: 30 mg via INTRAVENOUS

## 2021-01-18 MED ORDER — EPHEDRINE 5 MG/ML INJ: 10.0000 mg | INTRAVENOUS | Status: DC | PRN

## 2021-01-18 MED ORDER — FENTANYL CITRATE (PF) 100 MCG/2ML IJ SOLN
INTRAMUSCULAR | Status: AC
  Filled 2021-01-18: qty 2

## 2021-01-18 MED ORDER — CEFAZOLIN IN SODIUM CHLORIDE 3-0.9 GM/100ML-% IV SOLN
INTRAVENOUS | Status: AC
  Filled 2021-01-18: qty 100

## 2021-01-18 MED ORDER — LACTATED RINGERS IV SOLN: 500.0000 mL | Freq: Once | INTRAVENOUS | Status: DC

## 2021-01-18 MED ORDER — PHENYLEPHRINE 40 MCG/ML (10ML) SYRINGE FOR IV PUSH (FOR BLOOD PRESSURE SUPPORT): 80.0000 ug | PREFILLED_SYRINGE | INTRAVENOUS | Status: DC | PRN

## 2021-01-18 MED ORDER — ONDANSETRON HCL 4 MG/2ML IJ SOLN
INTRAMUSCULAR | Status: DC | PRN
  Administered 2021-01-18: 4 mg via INTRAVENOUS

## 2021-01-18 SURGICAL SUPPLY — 10 items
GAUZE PACKING 1 X5 YD ST (GAUZE/BANDAGES/DRESSINGS) ×1 IMPLANT
GLOVE SURG POLYISO LF SZ8 (GLOVE) ×3 IMPLANT
GOWN STRL REUS W/ TWL LRG LVL3 (GOWN DISPOSABLE) IMPLANT
GOWN STRL REUS W/TWL LRG LVL3 (GOWN DISPOSABLE) ×3
MAT PREVALON FULL STRYKER (MISCELLANEOUS) ×1 IMPLANT
NS IRRIG 1000ML POUR BTL (IV SOLUTION) ×1 IMPLANT
PACK VAGINAL MINOR WOMEN LF (CUSTOM PROCEDURE TRAY) ×1 IMPLANT
SET BERKELEY SUCTION TUBING (SUCTIONS) ×1 IMPLANT
SUT VIC AB 2-0 CT1 (SUTURE) ×4 IMPLANT
YANKAUER SUCT BULB TIP NO VENT (SUCTIONS) ×1 IMPLANT

## 2021-01-18 NOTE — Progress Notes (Signed)
Labor Progress Note [name] is a 30 y.o. G1P0000 at [redacted]w[redacted]d presented for IOL cHTN S: Pt had recurrent late decels on monitor. Went to bedside with Thressa Sheller DNP, CNM. Pt is sitting up in bed and comfortable.   O:  BP 133/62   Pulse 77   Temp (!) 97.5 F (36.4 C) (Oral)   Resp 17   Ht 5\' 2"  (1.575 m)   Wt (!) 167.9 kg   LMP 04/19/2020 (Exact Date)   SpO2 100%   BMI 67.71 kg/m  EFM: baseline 130 BPM/recurrent late decels present Toco: contractions q2-24min  CVE: Dilation: 8 Effacement (%): 80 Station: -2 Presentation: Vertex Exam by:: [email or web address], RN   A&P: 30 y.o. G1P0000 [redacted]w[redacted]d presented for IOL cHTN.  #IOL: Discontinued pit. Monitored the the FHR after d/c pitocin and monitor looked much better w/o decels. In ~30 min will restart pit 40ml/hr and titrate as possible.  #Pain: Epidural #FWB: cat 1 #GBS negative #cHTN: Labetalol 200mg  BID #BMI 67 #Rh Neg   18m, MD, PGY1 2:04 PM

## 2021-01-18 NOTE — Progress Notes (Signed)
Patient Vitals for the past 4 hrs:  BP Temp Temp src Pulse SpO2  01/18/21 0630 110/67 -- -- 71 --  01/18/21 0600 114/73 -- -- 76 --  01/18/21 0530 130/76 97.9 F (36.6 C) Oral 89 --  01/18/21 0515 127/75 -- -- 71 --  01/18/21 0500 132/72 -- -- 78 --  01/18/21 0430 127/81 -- -- 71 --  01/18/21 0350 (!) 124/44 -- -- 77 100 %  01/18/21 0345 117/62 -- -- 72 100 %  01/18/21 0340 124/63 -- -- 81 100 %  01/18/21 0335 (!) 111/55 -- -- 75 100 %  01/18/21 0330 (!) 111/55 97.9 F (36.6 C) Oral 79 100 %  01/18/21 0325 (!) 146/68 -- -- 84 100 %  01/18/21 0320 (!) 139/92 -- -- 97 100 %   Comfrotable w/epidural.  FHR Cat 1 (unless flat on back for exam, whereas had 1 late decel).  Cx 7-8/80/-2.  IUPC placed.  MVUs~ 200, but pattern q 2-4 minutes.  Pitocin on 26 mu/min. Will titrate up to improve pattern.

## 2021-01-18 NOTE — Anesthesia Procedure Notes (Signed)
Epidural Patient location during procedure: OB Start time: 01/18/2021 3:10 AM End time: 01/18/2021 3:26 AM  Staffing Anesthesiologist: Lowella Curb, MD Performed: anesthesiologist   Preanesthetic Checklist Completed: patient identified, IV checked, site marked, risks and benefits discussed, surgical consent, monitors and equipment checked, pre-op evaluation and timeout performed  Epidural Patient position: sitting Prep: ChloraPrep Patient monitoring: heart rate, cardiac monitor, continuous pulse ox and blood pressure Approach: midline Location: L2-L3 Injection technique: LOR saline  Needle:  Needle type: Tuohy  Needle gauge: 17 G Needle length: 9 cm Needle insertion depth: 9 cm Catheter type: closed end flexible Catheter size: 20 Guage Catheter at skin depth: 15 cm Test dose: negative  Assessment Events: blood not aspirated, injection not painful, no injection resistance, no paresthesia and negative IV test  Additional Notes Reason for block:procedure for pain

## 2021-01-18 NOTE — Progress Notes (Signed)
Labor Progress Note Mallory Atkins is a 30 y.o. G1P0000 at [redacted]w[redacted]d presented for IOL-cHTN. S: Doing well without complaints.  O:  BP 135/79   Pulse 84   Temp 97.6 F (36.4 C) (Axillary)   Resp 17   Ht 5\' 2"  (1.575 m)   Wt (!) 167.9 kg   LMP 04/19/2020 (Exact Date)   SpO2 100%   BMI 67.71 kg/m  EFM: baseline 140bpm/mod variability/+ accels/intermittent decels with contractions with position changes Toco: q2-4 min  CVE: Dilation: Lip/rim Effacement (%): 100 Station: 0 Presentation: Vertex Exam by:: [email or web address], MD   A&P: 30 y.o. G1P0000 [redacted]w[redacted]d presented for IOL-cHTN. #IOL: S/p cyto/CC. Pitocin started @1545  7/14 and discontinued today at 1345. Restarted intermittently throughout the day and turned off due to decels. Most recently restarted @1845 , currently @10mL /hr.  #Pain: epidural #FWB: cat 1 #GBS negative #cHTN: labetalol 200 mg BID, asymptomatic, no severe range BP. Continue to monitor. #Hydradenitis suppurativa: infliximab infusions, follows with derm  #Rh neg: Rhogam postpartum  , MD 8:27 PM

## 2021-01-18 NOTE — Discharge Summary (Signed)
Postpartum Discharge Summary     Patient Name: Mallory Atkins DOB: June 16, 1991 MRN: [id number]  Date of admission: 01/17/2021 Delivery date:01/18/2021  Delivering provider: Arrie Senate  Date of discharge: 01/20/2021  Admitting diagnosis: Chronic hypertension affecting pregnancy [O10.919] Intrauterine pregnancy: [redacted]w[redacted]d    Secondary diagnosis:  Active Problems:   Obesity affecting pregnancy, antepartum   Frequent fever blisters   Chronic hypertension affecting pregnancy   Rh negative state in antepartum period   Vacuum-assisted vaginal delivery   Type 3b perineal laceration   PPH (postpartum hemorrhage)  Additional problems: none    Discharge diagnosis: Term Pregnancy Delivered, CHTN, Anemia, and PPH                                              Post partum procedures: Iron Infusion Augmentation: Pitocin, Cytotec, and IP Foley Complications: [id number] Hospital course: Induction of Labor With Vaginal Delivery   30y.o. yo G1P0000 at 382w1das admitted to the hospital 01/17/2021 for induction of labor.  Indication for induction:  cHTN .  IOL started with cytotec and cook catheter. Later started on pitocin but ultimately had cat 2 tracing requiring multiple pauses in pitocin, IUPC/FSE placement, amnioinfusion, position changes and LR boluses. Ultimately patient was having recurrent deep variable/late decels with every contraction and cervix was manually reduced and patient had VAVD. 3b laceration noted with poor pain control and difficult visualization so decision made to proceed with repair in OR. Membrane Rupture Time/Date: 1:17 AM ,01/18/2021   Delivery Method:Vaginal, Vacuum (Extractor)  Episiotomy: None  Lacerations:  3rd degree;Sulcus  Details of delivery can be found in separate delivery note.  Patient had a routine postpartum course. Patient is discharged home 01/20/21.  Newborn Data: Birth date:01/18/2021  Birth time:10:12 PM  Gender:Female  Living  status:Living  Apgars:8 ,9  Weight:3289 g   Magnesium Sulfate received: No BMZ received: No Rhophylac:Yes MMR:N/A T-DaP:Given prenatally Flu: No Transfusion:No  Physical exam  Vitals:   01/19/21 1151 01/19/21 1528 01/19/21 1930 01/20/21 0601  BP: (!) 99/48 (!) 116/52 (!) 111/55 (!) 106/55  Pulse: 75 79 94 84  Resp: '18 18 16 18  ' Temp: (!) 97.5 F (36.4 C) 98 F (36.7 C) 98 F (36.7 C) 98 F (36.7 C)  TempSrc: Oral Oral Oral Oral  SpO2: 100% 100%  100%  Weight:      Height:       General: alert, cooperative, and no distress Lochia: appropriate Uterine Fundus: firm Incision: N/A DVT Evaluation: No evidence of DVT seen on physical exam. Calf/Ankle edema is present  Labs: Lab Results  Component Value Date   WBC 17.0 (H) 01/19/2021   HGB 7.1 (L) 01/19/2021   HCT 21.5 (L) 01/19/2021   MCV 90.7 01/19/2021   PLT 171 01/19/2021   CMP Latest Ref Rng & Units 01/17/2021  Glucose 70 - 99 mg/dL 139(H)  BUN 6 - 20 mg/dL 8  Creatinine 0.44 - 1.00 mg/dL 0.61  Sodium 135 - 145 mmol/L 135  Potassium 3.5 - 5.1 mmol/L 3.7  Chloride 98 - 111 mmol/L 107  CO2 22 - 32 mmol/L 19(L)  Calcium 8.9 - 10.3 mg/dL 8.6(L)  Total Protein 6.5 - 8.1 g/dL 5.9(L)  Total Bilirubin 0.3 - 1.2 mg/dL 0.3  Alkaline Phos 38 - 126 U/L 103  AST 15 - 41 U/L 16  ALT 0 -  44 U/L 12   Edinburgh Score: No flowsheet data found.   After visit meds:  Allergies as of 01/20/2021   No Known Allergies      Medication List     STOP taking these medications    aspirin EC 81 MG tablet   famotidine 10 MG tablet Commonly known as: PEPCID   labetalol 200 MG tablet Commonly known as: NORMODYNE   ondansetron 4 MG tablet Commonly known as: ZOFRAN   TUMS PO   Vitamin D (Ergocalciferol) 1.25 MG (50000 UNIT) Caps capsule Commonly known as: DRISDOL       TAKE these medications    acetaminophen 500 MG tablet Commonly known as: TYLENOL Take by mouth.   ferrous sulfate 325 (65 FE) MG tablet Take  1 tablet (325 mg total) by mouth every other day. Start taking on: January 21, 2021   ibuprofen 600 MG tablet Commonly known as: ADVIL Take 1 tablet (600 mg total) by mouth every 6 (six) hours as needed.   polyethylene glycol 17 g packet Commonly known as: MIRALAX / GLYCOLAX Take 17 g by mouth 2 (two) times daily.   Prenatal Gummies 0.18-25 MG Chew Chew 1 tablet by mouth daily.   senna-docusate 8.6-50 MG tablet Commonly known as: Senokot-S Take 2 tablets by mouth at bedtime.       ASK your doctor about these medications    acyclovir 400 MG tablet Commonly known as: ZOVIRAX Take 1 tablet (400 mg total) by mouth 3 (three) times daily.         Discharge home in stable condition Infant Feeding: Breast Infant Disposition:home with mother Discharge instruction: per After Visit Summary and Postpartum booklet. Activity: Advance as tolerated. Pelvic rest for 6 weeks.  Diet: routine diet Future Appointments: Future Appointments  Date Time Provider [place]  01/25/2021 11:30 AM [facility] NURSE [facility]   Follow up Visit: message sent to [facility] 01/18/21 by Sylvester Harder.   Please schedule this patient for a In person postpartum visit in 6 weeks with the following provider: Any provider. Additional Postpartum F/U:BP check 1 week and laceration evaluation (3b)   High risk pregnancy complicated by: HTN and morbid obesity Delivery mode:  Vaginal, Vacuum (Extractor)  Anticipated Birth Control:  Condoms   01/20/2021 Maryann Conners, CNM

## 2021-01-18 NOTE — Anesthesia Preprocedure Evaluation (Signed)
Anesthesia Evaluation  Patient identified by MRN, date of birth, ID band Patient awake    Reviewed: Allergy & Precautions, NPO status , Patient's Chart, lab work & pertinent test results  Airway Mallampati: II  TM Distance: >3 FB Neck ROM: Full    Dental no notable dental hx.    Pulmonary neg pulmonary ROS,    Pulmonary exam normal        Cardiovascular hypertension, Pt. on medications negative cardio ROS Normal cardiovascular exam     Neuro/Psych  Headaches, negative psych ROS   GI/Hepatic negative GI ROS, Neg liver ROS,   Endo/Other  Morbid obesity  Renal/GU negative Renal ROS  negative genitourinary   Musculoskeletal  (+) Arthritis , Osteoarthritis,    Abdominal (+) + obese,   Peds negative pediatric ROS (+)  Hematology negative hematology ROS (+)   Anesthesia Other Findings   Reproductive/Obstetrics (+) Pregnancy                             Anesthesia Physical  Anesthesia Plan  ASA: 3 and emergent  Anesthesia Plan: Epidural   Post-op Pain Management:    Induction:   PONV Risk Score and Plan: Treatment may vary due to age or medical condition  Airway Management Planned: Natural Airway  Additional Equipment:   Intra-op Plan:   Post-operative Plan:   Informed Consent: I have reviewed the patients History and Physical, chart, labs and discussed the procedure including the risks, benefits and alternatives for the proposed anesthesia with the patient or authorized representative who has indicated his/her understanding and acceptance.       Plan Discussed with:   Anesthesia Plan Comments: (To OR for 3rd degree perineal laceration)        Anesthesia Quick Evaluation

## 2021-01-18 NOTE — Lactation Note (Signed)
This note was copied from a baby's chart. Lactation Consultation Note Attempted to see mom. Mom in OR for tear repair. FOB holding baby STS in rm.  Patient Name: Mallory Atkins Date: 01/18/2021   Age:32 hours  Maternal Data    Feeding    LATCH Score                    Lactation Tools Discussed/Used    Interventions    Discharge    Consult Status      Charyl Dancer 01/18/2021, 11:08 PM

## 2021-01-18 NOTE — Progress Notes (Signed)
Labor Progress Note Ellyson Rarick is a 30 y.o. G1P0000 at [redacted]w[redacted]d presented for IOL cHTN.  S: Patient is resting comfortably with partner at bedside.  O:  BP 124/72   Pulse 70   Temp (!) 97.5 F (36.4 C) (Oral)   Resp 18   Ht 5\' 2"  (1.575 m)   Wt (!) 167.9 kg   LMP 04/19/2020 (Exact Date)   SpO2 100%   BMI 67.71 kg/m  EFM: baseline 130 BPM/good variability/+accels/-decel Toco: Contracting q46min  CVE: Dilation: 8 Effacement (%): 80 Station: -2 Presentation: Vertex Exam by:: [email or web address], RN   A&P: 30 y.o. G1P0000 [redacted]w[redacted]d presented for IOL cHTN.  #Labor: Progressing. Cyto x3. S/P CC, started pit@ 1545. Pit at 22 ml/hr. #Pain: Epidural #FWB: cat 1 #GBS negative #cHTN: Labetalol 200mg  BID #BMI 676 #Rh Neg  [redacted]w[redacted]d, MD Center for , [facility] 12:29 PM

## 2021-01-18 NOTE — Progress Notes (Signed)
Mallory Atkins is a 30 y.o. G1P0000 at [redacted]w[redacted]d admitted for induction of labor due to Hypertension.  Subjective:  Feeling some intermittent pressure now.   Objective: BP 138/87   Pulse 73   Temp (!) 97.5 F (36.4 C) (Oral)   Resp 17   Ht 5\' 2"  (1.575 m)   Wt (!) 167.9 kg   LMP 04/19/2020 (Exact Date)   SpO2 100%   BMI 67.71 kg/m  No intake/output data recorded. Total I/O In: -  Out: 1250 [Urine:1250]  FHT:  FHR: 130 bpm, variability: moderate,  accelerations:  Present,  decelerations:  Present Patient with prolonged deceleration. Patient moved to her right side and then moved to her left side. At that time FHR did not improve. Pitocin paused, and patient moved to a high fowlers position. SVE performed, and patient found to be 9.5cm and fetal head has moved down in the pelvis. Dr. 04/21/2020 notified, and then came to the bedside. FHR returned to baseline. Dr. Alysia Penna advised when resuming pitocin to not go above 10MU/min. Patient and family counseled and agreeable with plan. UC:   regular, every 3-4 minutes SVE:   Dilation: Lip/rim Effacement (%): 100 Station: 0 Exam by:: [email or web address], CNM  Labs: Lab Results  Component Value Date   WBC 13.9 (H) 01/18/2021   HGB 10.0 (L) 01/18/2021   HCT 29.8 (L) 01/18/2021   MCV 89.8 01/18/2021   PLT 198 01/18/2021    Assessment / Plan: Induction of labor due to [facility],  progressing well on pitocin  Labor: Progressing normally Preeclampsia:   NA Fetal Wellbeing:  Category II Pain Control:  Epidural I/D:  n/a Anticipated MOD:  NSVD   [facility] DNP, CNM  01/18/21  6:37 PM

## 2021-01-18 NOTE — Progress Notes (Signed)
Labor Progress Note Mallory Atkins is a 30 y.o. G1P0000 at [redacted]w[redacted]d presented for IOL cHTN S: Patient is resting comfortably.  O:  BP (!) 144/78   Pulse 67   Temp (!) 97.5 F (36.4 C) (Oral)   Resp 18   Ht 5\' 2"  (1.575 m)   Wt (!) 167.9 kg   LMP 04/19/2020 (Exact Date)   SpO2 100%   BMI 67.71 kg/m  EFM: baseline 130 BPM/-accels/one decel that corrected with position change/good variability Toco: contractions q2-11min  CVE: Dilation: 9 Effacement (%): 90 Station: -2 Presentation: Vertex Exam by:: [email or web address], RN  A&P: 30 y.o. G1P0000 [redacted]w[redacted]d presented for IOL cHTN #Labor: FSE came off during positional changes. Was reinserted by [redacted]w[redacted]d DNP, CNM successfully. Pit has titrated up to 29ml/hr. Cont to titrate and monitor.    #Pain: Epidural #FWB: cat 1 #GBS negative #cHTN: Labetalol 200mg  BID #BMI 67 #Rh Neg  12m, MD, PGY1 Center for , [facility] 4:28 PM

## 2021-01-18 NOTE — Progress Notes (Signed)
SROM around 0115, clear  Cx 6/80/-2 FHR Cat 1. Pitocin at 20 mu/min.  Continue present mgt.  Pt plans epidural.

## 2021-01-18 NOTE — Anesthesia Preprocedure Evaluation (Signed)
Anesthesia Evaluation  Patient identified by MRN, date of birth, ID band Patient awake    Reviewed: Allergy & Precautions, NPO status , Patient's Chart, lab work & pertinent test results  Airway Mallampati: II  TM Distance: >3 FB Neck ROM: Full    Dental no notable dental hx.    Pulmonary neg pulmonary ROS,    Pulmonary exam normal breath sounds clear to auscultation       Cardiovascular hypertension, Pt. on medications negative cardio ROS Normal cardiovascular exam Rhythm:Regular Rate:Normal     Neuro/Psych  Headaches, negative psych ROS   GI/Hepatic negative GI ROS, Neg liver ROS,   Endo/Other  Morbid obesity  Renal/GU negative Renal ROS  negative genitourinary   Musculoskeletal  (+) Arthritis , Osteoarthritis,    Abdominal (+) + obese,   Peds negative pediatric ROS (+)  Hematology negative hematology ROS (+)   Anesthesia Other Findings   Reproductive/Obstetrics (+) Pregnancy                             Anesthesia Physical Anesthesia Plan  ASA: 3  Anesthesia Plan: Epidural   Post-op Pain Management:    Induction:   PONV Risk Score and Plan:   Airway Management Planned:   Additional Equipment:   Intra-op Plan:   Post-operative Plan:   Informed Consent:   Plan Discussed with:   Anesthesia Plan Comments:         Anesthesia Quick Evaluation

## 2021-01-19 ENCOUNTER — Encounter (HOSPITAL_COMMUNITY): Payer: Self-pay | Admitting: Obstetrics & Gynecology

## 2021-01-19 LAB — CBC
HCT: 24.7 % — ABNORMAL LOW (ref 36.0–46.0)
HCT: 21.5 % — ABNORMAL LOW (ref 36.0–46.0)
Hemoglobin: 8.4 g/dL — ABNORMAL LOW (ref 12.0–15.0)
Hemoglobin: 7.1 g/dL — ABNORMAL LOW (ref 12.0–15.0)
MCH: 30.3 pg (ref 26.0–34.0)
MCH: 30 pg (ref 26.0–34.0)
MCHC: 34 g/dL (ref 30.0–36.0)
MCHC: 33 g/dL (ref 30.0–36.0)
MCV: 89.2 fL (ref 80.0–100.0)
MCV: 90.7 fL (ref 80.0–100.0)
Platelets: 183 10*3/uL (ref 150–400)
Platelets: 171 10*3/uL (ref 150–400)
RBC: 2.77 MIL/uL — ABNORMAL LOW (ref 3.87–5.11)
RBC: 2.37 MIL/uL — ABNORMAL LOW (ref 3.87–5.11)
RDW: 14 % (ref 11.5–15.5)
RDW: 13.9 % (ref 11.5–15.5)
WBC: 18 10*3/uL — ABNORMAL HIGH (ref 4.0–10.5)
WBC: 17 10*3/uL — ABNORMAL HIGH (ref 4.0–10.5)
nRBC: 0 % (ref 0.0–0.2)
nRBC: 0 % (ref 0.0–0.2)

## 2021-01-19 MED ORDER — NALBUPHINE HCL 10 MG/ML IJ SOLN: 5.0000 mg | Freq: Once | INTRAMUSCULAR | Status: DC | PRN

## 2021-01-19 MED ORDER — ONDANSETRON HCL 4 MG/2ML IJ SOLN
4.0000 mg | INTRAMUSCULAR | Status: DC | PRN
Start: 2021-01-19 — End: 2021-01-20

## 2021-01-19 MED ORDER — SODIUM CHLORIDE 0.9 % IV SOLN
500.0000 mg | Freq: Once | INTRAVENOUS | Status: AC
  Administered 2021-01-19: 500 mg via INTRAVENOUS
  Filled 2021-01-19: qty 25

## 2021-01-19 MED ORDER — DIBUCAINE (PERIANAL) 1 % EX OINT
1.0000 "application " | TOPICAL_OINTMENT | CUTANEOUS | Status: DC | PRN
  Filled 2021-01-19: qty 28

## 2021-01-19 MED ORDER — DIPHENHYDRAMINE HCL 25 MG PO CAPS: 25.0000 mg | ORAL_CAPSULE | Freq: Four times a day (QID) | ORAL | Status: DC | PRN

## 2021-01-19 MED ORDER — FERROUS SULFATE 325 (65 FE) MG PO TABS
325.0000 mg | ORAL_TABLET | ORAL | Status: DC
  Administered 2021-01-19: 325 mg via ORAL
  Filled 2021-01-19 (×2): qty 1

## 2021-01-19 MED ORDER — IBUPROFEN 600 MG PO TABS
600.0000 mg | ORAL_TABLET | Freq: Four times a day (QID) | ORAL | Status: DC
  Administered 2021-01-19 – 2021-01-20 (×7): 600 mg via ORAL
  Filled 2021-01-19 (×7): qty 1

## 2021-01-19 MED ORDER — TETANUS-DIPHTH-ACELL PERTUSSIS 5-2.5-18.5 LF-MCG/0.5 IM SUSY
0.5000 mL | PREFILLED_SYRINGE | Freq: Once | INTRAMUSCULAR | Status: DC
  Filled 2021-01-19: qty 0.5

## 2021-01-19 MED ORDER — NALOXONE HCL 0.4 MG/ML IJ SOLN: 0.4000 mg | INTRAMUSCULAR | Status: DC | PRN

## 2021-01-19 MED ORDER — FENTANYL CITRATE (PF) 100 MCG/2ML IJ SOLN
INTRAMUSCULAR | Status: AC
  Filled 2021-01-19: qty 2

## 2021-01-19 MED ORDER — ACETAMINOPHEN 325 MG PO TABS
650.0000 mg | ORAL_TABLET | ORAL | Status: DC | PRN
  Administered 2021-01-19 (×2): 650 mg via ORAL
  Filled 2021-01-19 (×2): qty 2

## 2021-01-19 MED ORDER — SIMETHICONE 80 MG PO CHEW
80.0000 mg | CHEWABLE_TABLET | ORAL | Status: DC | PRN
  Filled 2021-01-19: qty 1

## 2021-01-19 MED ORDER — AMISULPRIDE (ANTIEMETIC) 5 MG/2ML IV SOLN
10.0000 mg | Freq: Once | INTRAVENOUS | Status: DC | PRN
  Filled 2021-01-19: qty 4

## 2021-01-19 MED ORDER — ASCORBIC ACID 500 MG PO TABS
250.0000 mg | ORAL_TABLET | ORAL | Status: DC
  Administered 2021-01-19: 250 mg via ORAL
  Filled 2021-01-19 (×2): qty 1

## 2021-01-19 MED ORDER — POLYETHYLENE GLYCOL 3350 17 G PO PACK
17.0000 g | PACK | Freq: Two times a day (BID) | ORAL | Status: DC
  Administered 2021-01-19: 17 g via ORAL
  Filled 2021-01-19 (×2): qty 1

## 2021-01-19 MED ORDER — CHLOROPROCAINE HCL (PF) 3 % IJ SOLN
INTRAMUSCULAR | Status: AC
  Filled 2021-01-19: qty 40

## 2021-01-19 MED ORDER — ONDANSETRON HCL 4 MG/2ML IJ SOLN: 4.0000 mg | Freq: Once | INTRAMUSCULAR | Status: DC | PRN

## 2021-01-19 MED ORDER — FENTANYL CITRATE (PF) 100 MCG/2ML IJ SOLN
25.0000 ug | INTRAMUSCULAR | Status: DC | PRN
  Administered 2021-01-19: 50 ug via INTRAVENOUS

## 2021-01-19 MED ORDER — BENZOCAINE-MENTHOL 20-0.5 % EX AERO
1.0000 "application " | INHALATION_SPRAY | CUTANEOUS | Status: DC | PRN
  Filled 2021-01-19: qty 56

## 2021-01-19 MED ORDER — NIFEDIPINE ER OSMOTIC RELEASE 30 MG PO TB24
30.0000 mg | ORAL_TABLET | Freq: Every day | ORAL | Status: DC
  Filled 2021-01-19 (×2): qty 1

## 2021-01-19 MED ORDER — NALBUPHINE HCL 10 MG/ML IJ SOLN: 5.0000 mg | INTRAMUSCULAR | Status: DC | PRN

## 2021-01-19 MED ORDER — DIPHENHYDRAMINE HCL 50 MG/ML IJ SOLN: 12.5000 mg | INTRAMUSCULAR | Status: DC | PRN

## 2021-01-19 MED ORDER — NALOXONE HCL 4 MG/10ML IJ SOLN
1.0000 ug/kg/h | INTRAVENOUS | Status: DC | PRN
  Filled 2021-01-19: qty 5

## 2021-01-19 MED ORDER — RHO D IMMUNE GLOBULIN 1500 UNIT/2ML IJ SOSY
300.0000 ug | PREFILLED_SYRINGE | Freq: Once | INTRAMUSCULAR | Status: DC
  Filled 2021-01-19: qty 2

## 2021-01-19 MED ORDER — OXYCODONE HCL 5 MG PO TABS
5.0000 mg | ORAL_TABLET | Freq: Four times a day (QID) | ORAL | Status: DC | PRN
  Administered 2021-01-19 – 2021-01-20 (×4): 5 mg via ORAL
  Filled 2021-01-19 (×4): qty 1

## 2021-01-19 MED ORDER — PRENATAL MULTIVITAMIN CH
1.0000 | ORAL_TABLET | Freq: Every day | ORAL | Status: DC
  Administered 2021-01-19 – 2021-01-20 (×2): 1 via ORAL
  Filled 2021-01-19 (×3): qty 1

## 2021-01-19 MED ORDER — KETOROLAC TROMETHAMINE 30 MG/ML IJ SOLN: 30.0000 mg | Freq: Four times a day (QID) | INTRAMUSCULAR | Status: DC | PRN

## 2021-01-19 MED ORDER — RHO D IMMUNE GLOBULIN 1500 UNIT/2ML IJ SOSY
300.0000 ug | PREFILLED_SYRINGE | Freq: Once | INTRAMUSCULAR | Status: AC
  Administered 2021-01-19: 300 ug via INTRAVENOUS
  Filled 2021-01-19: qty 2

## 2021-01-19 MED ORDER — ONDANSETRON HCL 4 MG PO TABS: 4.0000 mg | ORAL_TABLET | ORAL | Status: DC | PRN

## 2021-01-19 MED ORDER — ONDANSETRON HCL 4 MG/2ML IJ SOLN: 4.0000 mg | Freq: Three times a day (TID) | INTRAMUSCULAR | Status: DC | PRN

## 2021-01-19 MED ORDER — SCOPOLAMINE 1 MG/3DAYS TD PT72
1.0000 | MEDICATED_PATCH | Freq: Once | TRANSDERMAL | Status: DC
  Administered 2021-01-19: 1.5 mg via TRANSDERMAL
  Filled 2021-01-19: qty 1

## 2021-01-19 MED ORDER — OXYCODONE HCL 5 MG PO TABS: 5.0000 mg | ORAL_TABLET | Freq: Once | ORAL | Status: DC | PRN

## 2021-01-19 MED ORDER — ONDANSETRON HCL 4 MG/2ML IJ SOLN
INTRAMUSCULAR | Status: AC
  Filled 2021-01-19: qty 2

## 2021-01-19 MED ORDER — SENNOSIDES-DOCUSATE SODIUM 8.6-50 MG PO TABS
2.0000 | ORAL_TABLET | ORAL | Status: DC
  Administered 2021-01-19: 2 via ORAL
  Filled 2021-01-19 (×2): qty 2

## 2021-01-19 MED ORDER — MEASLES, MUMPS & RUBELLA VAC IJ SOLR
0.5000 mL | Freq: Once | INTRAMUSCULAR | Status: DC
  Filled 2021-01-19: qty 0.5

## 2021-01-19 MED ORDER — MEPERIDINE HCL 25 MG/ML IJ SOLN: 6.2500 mg | INTRAMUSCULAR | Status: DC | PRN

## 2021-01-19 MED ORDER — PHENYLEPHRINE 40 MCG/ML (10ML) SYRINGE FOR IV PUSH (FOR BLOOD PRESSURE SUPPORT)
PREFILLED_SYRINGE | INTRAVENOUS | Status: AC
  Filled 2021-01-19: qty 10

## 2021-01-19 MED ORDER — WITCH HAZEL-GLYCERIN EX PADS
1.0000 "application " | MEDICATED_PAD | CUTANEOUS | Status: DC | PRN
  Administered 2021-01-19: 1 via TOPICAL

## 2021-01-19 MED ORDER — OXYCODONE HCL 5 MG/5ML PO SOLN: 5.0000 mg | Freq: Once | ORAL | Status: DC | PRN

## 2021-01-19 MED ORDER — SODIUM CHLORIDE 0.9% FLUSH: 3.0000 mL | INTRAVENOUS | Status: DC | PRN

## 2021-01-19 MED ORDER — DIPHENHYDRAMINE HCL 25 MG PO CAPS: 25.0000 mg | ORAL_CAPSULE | ORAL | Status: DC | PRN

## 2021-01-19 MED ORDER — COCONUT OIL OIL
1.0000 "application " | TOPICAL_OIL | Status: DC | PRN
  Filled 2021-01-19: qty 120

## 2021-01-19 NOTE — Lactation Note (Signed)
This note was copied from a baby's chart. Lactation Consultation Note Called to PACU to assist in latching baby after mom returned from surgery.  Mom was in a lot of pain, laying on her side on the bed rail d/t hurts to bad to be on her buttocks. Mom has 3 rd degree tear.  FOB holding baby STS.  LC hand expressed some colostrum in a spoon. Noted mom had nipple piercing's. Mom removed bars when she found out she was pregnant. LC spoon fed baby colostrum. She took it well. Mom will be f/u on MBU.  Patient Name: Girl Eknoor Novack JSEGB'T Date: 01/19/2021 Reason for consult: L&D Initial assessment;Primapara;Term Age:68 hours  Maternal Data Has patient been taught Hand Expression?: Yes Does the patient have breastfeeding experience prior to this delivery?: No  Feeding    LATCH Score       Type of Nipple: Everted at rest and after stimulation  Comfort (Breast/Nipple): Soft / non-tender         Lactation Tools Discussed/Used    Interventions    Discharge    Consult Status Consult Status: Follow-up Date: 01/19/21 Follow-up type: In-patient    [place] Abbett, Diamond Nickel 01/19/2021, 1:10 AM

## 2021-01-19 NOTE — Progress Notes (Signed)
Post Partum Day 1 Subjective: Patient is doing well without complaints. Ambulating without difficulty. Foley catheter still in place. Passing flatus. Tolerating PO. Abdominal pain improved. Vaginal bleeding decreased. Denies lightheadedness or dizziness.  Objective: Blood pressure 127/78, pulse 77, temperature 98 F (36.7 C), temperature source Oral, resp. rate 20, height 5\' 2"  (1.575 m), weight (!) 167.9 kg, last menstrual period 04/19/2020, SpO2 100 %, unknown if currently breastfeeding.  Physical Exam:  General: alert, cooperative, and no distress Lochia: appropriate Uterine Fundus: firm Incision: n/a DVT Evaluation: No evidence of DVT seen on physical exam.  Recent Labs    01/19/21 0105 01/19/21 0426  HGB 8.4* 7.1*  HCT 24.7* 21.5*    Assessment/Plan: PPD#1 VAVD with 3b lac  -will dc foley catheter and monitor for UOP  -otherwise meeting pp milestones  -working on breastfeeding, engage lactation  -counseled on contraception  #Acute blood loss anemia #PPH  -EBL 1103 plus OR EBL  -asymptomatic  -venofer and PO iron ordered  -if patient become symptomatic consider transfusion  #cHTN  -transitioned from labetalol to procardia xl 30  -asymptomatic  #Rh neg  -baby positive  -rhogam   Plan for discharge tomorrow given time of delivery/complications.   LOS: 2 days   01/21/21 01/19/2021, 7:09 AM

## 2021-01-19 NOTE — Anesthesia Postprocedure Evaluation (Signed)
Anesthesia Post Note  Patient: Mallory Atkins  Procedure(s) Performed: SUTURE REPAIR PERINEAL LACERATION     Patient location during evaluation: PACU Anesthesia Type: Epidural Level of consciousness: oriented and awake and alert Pain management: pain level controlled Vital Signs Assessment: post-procedure vital signs reviewed and stable Respiratory status: spontaneous breathing, respiratory function stable and nonlabored ventilation Cardiovascular status: blood pressure returned to baseline and stable Postop Assessment: no headache, no backache, no apparent nausea or vomiting and epidural receding Anesthetic complications: no   No notable events documented.  Last Vitals:  Vitals:   01/19/21 0000 01/19/21 0015  BP: (!) 109/57 108/78  Pulse: 86 98  Resp: (!) 21 20  Temp: 36.4 C   SpO2:  100%    Last Pain:  Vitals:   01/19/21 0000  TempSrc:   PainSc: 0-No pain   Pain Goal:                Epidural/Spinal Function Cutaneous sensation: Pins and Needles (01/19/21 0000), Patient able to flex knees: Yes (01/19/21 0000), Patient able to lift hips off bed: No (01/19/21 0000), Back pain beyond tenderness at insertion site: No (01/19/21 0000), Progressively worsening motor and/or sensory loss: No (01/19/21 0000), Bowel and/or bladder incontinence post epidural: No (01/19/21 0000)  Lucretia Kern

## 2021-01-19 NOTE — Op Note (Signed)
Pre Op: Repair of vaginal laceration following VAVD Post Op: SAA Procedure: Repair of right sulcus laceration, second degree laceration and 3B laceration Attending: M. Alysia Penna, MD Assistant: Mart Piggs, MD Anesthesia: Epidural  EBL < 100 cc Fluids: As recorded UOP: As recorded   Description: Pt was brought back to the OR C. Epidural was dosed to an adequate level. Pt was prepped and draped in the usual sterile fashion. Pt received 3 gms Ancef. Time out was completed. Foley cathter was already in place.  Deaver's were placed for exposure. The apex of the sulcus laceration was identified and secured. The laceration was repaired with 2/0 Vicryl down to the hymenal ring. The 3B laceration was repaired with 2/0 Vicryl incorporating the muscle capsule. The repaired of the repair was completed in a layer fashion with 2/0 and 3/0 Vicryl. Rectal exam was performed, and noted to be intact without any evidence of defect. Counts were correct x 2. Pt was taken to PACU in stable condition.

## 2021-01-19 NOTE — Anesthesia Postprocedure Evaluation (Signed)
Anesthesia Post Note  Patient: Mallory Atkins  Procedure(s) Performed: AN AD HOC LABOR EPIDURAL     Patient location during evaluation: Mother Baby Anesthesia Type: Epidural Level of consciousness: awake and alert Pain management: pain level controlled Vital Signs Assessment: post-procedure vital signs reviewed and stable Respiratory status: spontaneous breathing, nonlabored ventilation and respiratory function stable Cardiovascular status: stable Postop Assessment: no headache, no backache and epidural receding Anesthetic complications: no   No notable events documented.  Last Vitals:  Vitals:   01/19/21 0644 01/19/21 0812  BP: 127/78 (!) 113/52  Pulse: 77 69  Resp: 20 18  Temp: 36.7 C (!) 36.4 C  SpO2: 100% 100%    Last Pain:  Vitals:   01/19/21 0812  TempSrc: Oral  PainSc:    Pain Goal:                   Rica Records

## 2021-01-19 NOTE — Lactation Note (Signed)
This note was copied from a baby's chart. Lactation Consultation Note  Patient Name: Mallory Atkins [id number] Date: 01/19/2021 Reason for consult: Follow-up assessment;Mother's request;Difficult latch;Primapara;1st time breastfeeding;Term;Other (Comment) (Gest HTN. Removal of sweat follicle and breast tissue on left breast little smaller than right. Mom noted leaking colostrum both breasts prior to delivery, breast darker and bigger) Age:32 hours  Mom instructed to feed by cues 8-12x in 24 hr period no more than 4 hrs without an attempt. Mom mostly offering EBM via spoon or curve tip and not able to get infant to latch.  Infant has thick labial attachment, lingual attachment and high palate. LC tried latching in both football and laid back but not able to sustain the latch. Mom small nipples short shafted but will evert with stimulation.  LC latched infant for 12 minutes use of 20 NS.   LC assessed flange size with manual pump at 21. Mom stated comfortable fit and will pre pump 5-10 min before latching.   Mom in middle of iron transfusion so we reviewed feeding cues 8-12x in24  period not to go more than 4 hrs without an attempt.   Plan 1 to feed by cues as stated above, mom to try latching at breast, offering both breasts and looking for swallows. If unable to sustain the latch use 20 NS.        2. Mom to supplement with 5 french and syringe at next feeding if infant still cues after latching. ( Supplementing with curve tip but volumes are more now safer to use 5 french feeding tube with syringe) Breastfeeding supplementation guide reviewed with parents.  3. Mom to use personal electric pump q 3 hrs for 15 min 4 I and O sheet reviewed.  5. LC brochure of inpatient and outpatient services reviewed.   All questions answered at the end of the visit.  Mom to call for assistance with next feeding.   Maternal Data Has patient been taught Hand Expression?: Yes Does the patient have  breastfeeding experience prior to this delivery?: No  Feeding Mother's Current Feeding Choice: Breast Milk  LATCH Score Latch: Repeated attempts needed to sustain latch, nipple held in mouth throughout feeding, stimulation needed to elicit sucking reflex.  Audible Swallowing: Spontaneous and intermittent  Type of Nipple: Flat  Comfort (Breast/Nipple): Soft / non-tender  Hold (Positioning): Assistance needed to correctly position infant at breast and maintain latch.  LATCH Score: 7   Lactation Tools Discussed/Used Tools: Nipple Shields;Pump;Flanges;Shells Nipple shield size: 20 Flange Size: 21 (LC assessed flange size with 24 and then reduced to 21 flange) Breast pump type: Other (comment) (personal lansinoh pump) Pump Education: Setup, frequency, and cleaning;Milk Storage Reason for Pumping: increase stimulation NS barrier Pumping frequency: every 3 hrs for 15 min  Interventions Interventions: Breast feeding basics reviewed;Support pillows;Education;Assisted with latch;Position options;Skin to skin;Expressed milk;Breast massage;Hand express;Pre-pump if needed;Breast compression;Adjust position  Discharge Pump: Personal WIC Program: No  Consult Status Consult Status: Follow-up Date: 01/20/21 Follow-up type: In-patient    Deontrae Drinkard  Nicholson-Springer 01/19/2021, 4:59 PM

## 2021-01-20 LAB — RH IG WORKUP (INCLUDES ABO/RH)
Fetal Screen: NEGATIVE
Gestational Age(Wks): 39
Unit division: 0

## 2021-01-20 MED ORDER — IBUPROFEN 600 MG PO TABS: 600.0000 mg | ORAL_TABLET | Freq: Four times a day (QID) | ORAL | 1 refills | Status: DC | PRN

## 2021-01-20 MED ORDER — SENNOSIDES-DOCUSATE SODIUM 8.6-50 MG PO TABS: 2.0000 | ORAL_TABLET | Freq: Every day | ORAL | 2 refills | Status: DC

## 2021-01-20 MED ORDER — POLYETHYLENE GLYCOL 3350 17 G PO PACK: 17.0000 g | PACK | Freq: Two times a day (BID) | ORAL | 0 refills | Status: DC

## 2021-01-20 MED ORDER — FERROUS SULFATE 325 (65 FE) MG PO TABS: 325.0000 mg | ORAL_TABLET | ORAL | 3 refills | Status: DC

## 2021-01-20 NOTE — Transfer of Care (Signed)
Immediate Anesthesia Transfer of Care Note  Patient: Mallory Atkins  Procedure(s) Performed: SUTURE REPAIR PERINEAL LACERATION  Patient Location: PACU  Anesthesia Type:Epidural  Level of Consciousness: awake, alert  and oriented  Airway & Oxygen Therapy: Patient Spontanous Breathing  Post-op Assessment: Report given to RN and Post -op Vital signs reviewed and stable  Post vital signs: Reviewed and stable  Last Vitals:  Vitals Value Taken Time  BP 106/55 01/20/21 0601  Temp 36.7 C 01/20/21 0601  Pulse 84 01/20/21 0601  Resp 18 01/20/21 0601  SpO2 100 % 01/20/21 0601    Last Pain:  Vitals:   01/20/21 0945  TempSrc:   PainSc: 0-No pain         Complications: No notable events documented.

## 2021-01-20 NOTE — Lactation Note (Addendum)
This note was copied from a baby's chart. Lactation Consultation Note  Patient Name: Mallory Atkins [id number] Date: 01/20/2021 Reason for consult: Follow-up assessment;Term;Primapara Age:32 hours  I entered room & ready to feed. Parents were interested in trying to latch infant. "[name]" did latch & suckle briefly, but was extremely fussy and it was apparent infant was ready for more volume. When infant cries, an anterior frenulum is visible & tension is noted on the frenulum.   Parents had discovered that the yellow Similac slow flow nipple is too fast. Infant is doing better with the purple extra-slow flow nipple; however, I noted that infant will "lose" suction with the bottle despite chin support and what seems to be comfortable swallowing. I shared my observation with Dr.Soufleris. Since SLPs are not here today, I will see if the Nfant Slow flow is helpful.   Parents to call me when ready for me to return. Mom has a Lansinoh pump at home; she will need size 21 flanges or Maymom inserts.   Pacifier noted at bedside; parents admit to using it when infant is fussy. In light of infant's bili levels, I recommended that parents offer bottle again to see if infant is hungry.    [name] [facility] 01/20/2021, 11:49 AM

## 2021-01-20 NOTE — Lactation Note (Signed)
This note was copied from a baby's chart. Lactation Consultation Note  Patient Name: Mallory Atkins [id number] Date: 01/20/2021 Reason for consult: Follow-up assessment;Term;Primapara;Mother's request Age:32 hours  I spoke with parents again as I noted that RN had documented that infant had drunk 26 mL. Parents had success by keeping infant unwrapped & using chin support as I had previously shown them.   Mom knows how to operate the DEBP and will use it as she is ready.  [name] [facility] 01/20/2021, 1:23 PM

## 2021-01-20 NOTE — Lactation Note (Addendum)
This note was copied from a baby's chart. Lactation Consultation Note  Patient Name: Mallory Atkins [id number] Date: 01/20/2021 Reason for consult: Follow-up assessment;Term;Primapara Age:32 hours  Parents called for me to return as infant began cueing again. The Nfant Slow flow was attempted, but without success, as infant was unable to drink from the nipple. We returned to the Enfamil Extra-Slow flow nipple since infant is able to drink from that nipple safely. Infant was still observed to intermittently lose suction and discontinuing her bottle feeding session early. The last 2 feedings infant has drunk 9 mL. I shared my observation with Dr. Viann Fish and mentioned the possibility that infant may benefit from a frenotomy to see if infant does better with bottle feeding.  Per parents, with the yellow Similac slow-flow nipple, formula would come out of the sides of infant's mouth and "Mallory Atkins" would seem to choke at times.   Mom has her personal pump here, but does not have the proper flanges. Mom consented to being set up with a DEBP while here so that she can use size 21 flanges. Ann Held, RN will show Mom how to use the pump. I suggested that Mom pump every time infant receives formula.   [name] [facility] 01/20/2021, 12:21 PM

## 2021-01-20 NOTE — Progress Notes (Signed)
Mallory Atkins  Post Partum Day Two:S/P VAVD with Repair of 3b Laceration  Subjective: Patient up ad lib, denies syncope or dizziness. Reports consuming regular diet without issues and denies N/V. Denies issues with urination and reports bleeding is "good."  Patient is breastfeeding and reports going well.  Desires condoms for postpartum contraception.  Pain is being appropriately managed with use of tylenol and motrin.  Reports having a bowel movement yesterday.  Objective: Vitals:   01/19/21 1151 01/19/21 1528 01/19/21 1930 01/20/21 0601  BP: (!) 99/48 (!) 116/52 (!) 111/55 (!) 106/55  Pulse: 75 79 94 84  Resp: 18 18 16 18   Temp: (!) 97.5 F (36.4 C) 98 F (36.7 C) 98 F (36.7 C) 98 F (36.7 C)  TempSrc: Oral Oral Oral Oral  SpO2: 100% 100%  100%  Weight:      Height:       Recent Labs    01/19/21 0105 01/19/21 0426  HGB 8.4* 7.1*  HCT 24.7* 21.5*    Physical Exam:  General: alert, cooperative, fatigued, and no distress Mood/Affect: Appropriate/Appropriate Lungs: clear to auscultation, no wheezes, rales or rhonchi, symmetric air entry.  Heart: normal rate and regular rhythm. Breast: breasts appear normal, no suspicious masses, no skin or nipple changes or axillary nodes. Abdomen:  + bowel sounds, Soft Uterine Fundus: firm, U/-2 Lochia: appropriate Laceration: Not Assessed Skin: Warm, Dry DVT Evaluation: No evidence of DVT seen on physical exam. Calf/Ankle edema is present.  Assessment S/P Vaginal Delivery-Day 2 Normal Involution BreastFeeding Anemia CHTN  Plan: Discharge planning discussed. Instructed to continue PNV. Rx for ibuprofen, miralax, sennokot, and iron infusion. Precautions Given Plan for BP check in one week.    01/21/21, MSN, CNM 01/20/2021, 7:40 AM

## 2021-01-21 ENCOUNTER — Telehealth: Payer: Self-pay | Admitting: Women's Health

## 2021-01-21 NOTE — Telephone Encounter (Signed)
Pt requesting something for pain post delivery Alternating Tylenol & Mortin is not helping  Please advise & notify pt    [facility] - [address], [place]

## 2021-01-22 ENCOUNTER — Other Ambulatory Visit: Payer: Self-pay | Admitting: Obstetrics & Gynecology

## 2021-01-22 MED ORDER — HYDROCODONE-ACETAMINOPHEN 5-325 MG PO TABS: 1.0000 | ORAL_TABLET | Freq: Four times a day (QID) | ORAL | 0 refills | Status: DC | PRN

## 2021-01-23 ENCOUNTER — Telehealth: Payer: Self-pay | Admitting: Pharmacy Technician

## 2021-01-23 ENCOUNTER — Other Ambulatory Visit: Payer: Self-pay

## 2021-01-23 ENCOUNTER — Encounter: Payer: Self-pay | Admitting: Advanced Practice Midwife

## 2021-01-23 ENCOUNTER — Ambulatory Visit (INDEPENDENT_AMBULATORY_CARE_PROVIDER_SITE_OTHER): Payer: BC Managed Care – PPO | Admitting: Advanced Practice Midwife

## 2021-01-23 VITALS — BP 166/96 | HR 82

## 2021-01-23 DIAGNOSIS — O10919 Unspecified pre-existing hypertension complicating pregnancy, unspecified trimester: Secondary | ICD-10-CM

## 2021-01-23 MED ORDER — NIFEDIPINE ER OSMOTIC RELEASE 60 MG PO TB24: 60.0000 mg | ORAL_TABLET | Freq: Every day | ORAL | 1 refills | Status: DC

## 2021-01-23 MED ORDER — FUROSEMIDE 40 MG PO TABS: 40.0000 mg | ORAL_TABLET | Freq: Two times a day (BID) | ORAL | 0 refills | Status: DC

## 2021-01-23 NOTE — Progress Notes (Signed)
GYN VISIT Patient name: Mallory Atkins MRN [id number]  Date of birth: 02/08/1991 Chief Complaint:   Blood Pressure Check (And check laceration)  History of Present Illness:   Mallory Atkins is a 30 y.o. G70P1001 Caucasian female being seen today for BP check and eval of 3b lac healing. Denies s/s preeclampsia- has been having mod range elevated BPs at home x past 6hrs; took a Vicodin yesterday for lac pain/discomfort. Now bottlefeeding the baby girl. Patient's last menstrual period was 04/19/2020 (exact date).   Depression screen Mallory Atkins 2/9 10/30/2020 08/03/2020 05/17/2020  Decreased Interest 0 0 0  Down, Depressed, Hopeless 0 0 0  PHQ - 2 Score 0 0 0  Altered sleeping 0 0 0  Tired, decreased energy 1 1 0  Change in appetite 0 0 0  Feeling bad or failure about yourself  0 0 0  Trouble concentrating 0 0 0  Moving slowly or fidgety/restless 0 0 0  Suicidal thoughts 0 0 0  PHQ-9 Score 1 1 0     GAD 7 : Generalized Anxiety Score 10/30/2020 08/03/2020 05/17/2020  Nervous, Anxious, on Edge 0 0 0  Control/stop worrying 0 0 0  Worry too much - different things 0 0 0  Trouble relaxing 0 0 0  Restless 0 0 0  Easily annoyed or irritable 0 0 0  Afraid - awful might happen 0 0 0  Total GAD 7 Score 0 0 0     Review of Systems:   Pertinent items are noted in HPI Denies fever/chills, dizziness, headaches, visual disturbances, fatigue, shortness of breath, chest pain, abdominal pain, vomiting, abnormal vaginal discharge/itching/odor/irritation, problems with periods, bowel movements, urination, or intercourse unless otherwise stated above.  Pertinent History Reviewed:  Reviewed past medical,surgical, social, obstetrical and family history.  Reviewed problem list, medications and allergies. Physical Assessment:   Vitals:   01/23/21 0938 01/23/21 0953  BP: (!) 170/89 (!) 166/96  Pulse: 82   There is no height or weight on file to calculate BMI.       Physical Examination:   General appearance:  alert, well appearing, and in no distress  Mental status: alert, oriented to person, place, and time  Skin: warm & dry   Cardiovascular: normal heart rate noted  Respiratory: normal respiratory effort, no distress  Abdomen: soft, non-tender   Pelvic: appropriate lac healing; no s/s infection  Extremities: 3+ pitting edema BLE    No results found for this or any previous visit (from the past 24 hour(s)).  Assessment & Plan:  1) 5d s/p vacuum delivery and 3B lac repair> lac healing appropriately  2) cHTN (no meds) with mod range elevations> rx ProcardiaXL 60mg  and Lasix 40mg  bid x 7d; RN MyChart BP visit in 1wk; to call w s/s pre-e or if BP values don't improve in 2-3d  Meds:  Meds ordered this encounter  Medications   NIFEdipine (PROCARDIA XL) 60 MG 24 hr tablet    Sig: Take 1 tablet (60 mg total) by mouth daily.    Dispense:  30 tablet    Refill:  1    Order Specific Question:   Supervising Provider    Answer:   , LUTHER H [[id number]]   furosemide (LASIX) 40 MG tablet    Sig: Take 1 tablet (40 mg total) by mouth 2 (two) times daily.    Dispense:  14 tablet    Refill:  0    Order Specific Question:   Supervising Provider    Answer:  EURE, LUTHER H [[id number]]    No orders of the defined types were placed in this encounter.   Return for RN BP check 1wk MyChart.  Mallory Atkins CNM 01/23/2021 10:08 AM

## 2021-01-23 NOTE — Telephone Encounter (Signed)
Auth Submission: approved Payer: [employer] Medication & CPT/J Code(s) submitted: Remicade (Infliximab) J1745 Route of submission (phone, fax, portal): COVER MY MEDS Auth type: Buy/Bill Units/visits requested: 1600 MG  - 10MG /KG Reference number:   Awaiting therapy plan and MD confirmation/approval.

## 2021-01-24 ENCOUNTER — Inpatient Hospital Stay (HOSPITAL_COMMUNITY): Admit: 2021-01-24 | Payer: Self-pay

## 2021-01-24 NOTE — Telephone Encounter (Signed)
Left v/m with patient. Awaiting call back  (Note/fyi) Will need to f/u if patient is agreeable to transferring to [facility], last dose, current infusion center (MD) and verify dose.

## 2021-01-28 DIAGNOSIS — D649 Anemia, unspecified: Secondary | ICD-10-CM | POA: Diagnosis not present

## 2021-01-29 NOTE — Telephone Encounter (Signed)
Left 2nd v/m with patient. Awaiting call back

## 2021-01-30 ENCOUNTER — Telehealth (INDEPENDENT_AMBULATORY_CARE_PROVIDER_SITE_OTHER): Payer: BC Managed Care – PPO | Admitting: *Deleted

## 2021-01-30 ENCOUNTER — Telehealth (HOSPITAL_COMMUNITY): Payer: Self-pay

## 2021-01-30 DIAGNOSIS — Z0130 Encounter for examination of blood pressure without abnormal findings: Secondary | ICD-10-CM

## 2021-01-30 DIAGNOSIS — O10919 Unspecified pre-existing hypertension complicating pregnancy, unspecified trimester: Secondary | ICD-10-CM

## 2021-01-30 NOTE — Progress Notes (Addendum)
   NURSE VISIT- BLOOD PRESSURE CHECK  SUBJECTIVE:  Mallory Atkins is a 30 y.o. G63P1001 female here for BP check. She is postpartum, delivery date 01/18/21     HYPERTENSION ROS:  Postpartum:  Severe headaches that don't go away with tylenol/other medicines: No  Visual changes (seeing spots/double/blurred vision) No  Severe pain under right breast breast or in center of upper chest No  Severe nausea/vomiting No  Taking medicines as instructed yes  OBJECTIVE:  BP 131/86 (BP Location: Right Arm, Patient Position: Sitting, Cuff Size: Large)   Pulse 92   Breastfeeding No   Appearance alert, well appearing, and in no distress and oriented to person, place, and time.  ASSESSMENT: Postpartum  blood pressure check  PLAN: Discussed with Dr. Charlotta Newton   Recommendations:no changes needed   Follow-up: as scheduled   Jobe Marker  01/30/2021 3:55 PM  Chart reviewed for nurse visit. Agree with plan of care.  [name], DO 01/31/2021 2:36 PM

## 2021-01-30 NOTE — Telephone Encounter (Signed)
No answer. Left message to return nurse call.  Marcelino Duster [facility] 01/30/2021,1735

## 2021-02-01 DIAGNOSIS — Z79899 Other long term (current) drug therapy: Secondary | ICD-10-CM | POA: Diagnosis not present

## 2021-02-01 DIAGNOSIS — L732 Hidradenitis suppurativa: Secondary | ICD-10-CM | POA: Diagnosis not present

## 2021-02-08 DIAGNOSIS — M7711 Lateral epicondylitis, right elbow: Secondary | ICD-10-CM | POA: Diagnosis not present

## 2021-02-14 ENCOUNTER — Other Ambulatory Visit: Payer: Self-pay | Admitting: Advanced Practice Midwife

## 2021-02-18 ENCOUNTER — Ambulatory Visit (INDEPENDENT_AMBULATORY_CARE_PROVIDER_SITE_OTHER): Payer: BC Managed Care – PPO | Admitting: Obstetrics & Gynecology

## 2021-02-18 ENCOUNTER — Encounter: Payer: Self-pay | Admitting: Obstetrics & Gynecology

## 2021-02-18 ENCOUNTER — Other Ambulatory Visit: Payer: Self-pay

## 2021-02-18 NOTE — Progress Notes (Signed)
   GYN VISIT Patient name: Mallory Atkins MRN [id number]  Date of birth: [birth year] Chief Complaint:   Wound Check (Had tear with delivery that is splitting open)  History of Present Illness:   Mallory Atkins is a 30 y.o. G76P1001  female being seen today for the following concerns:  Vaginal issues: She is concerned that there is a "hole" looks to be red with slight discharge.  Tried to keep it clean and dry.  Notes worsening soreness.   Delivered 7/15- complicated by 3b laceration and PPH.  Pt seen on 01/23/21- appeared to be healing appropriately at that time.  No LMP recorded.  Depression screen [facility] 2/9 10/30/2020 08/03/2020 05/17/2020  Decreased Interest 0 0 0  Down, Depressed, Hopeless 0 0 0  PHQ - 2 Score 0 0 0  Altered sleeping 0 0 0  Tired, decreased energy 1 1 0  Change in appetite 0 0 0  Feeling bad or failure about yourself  0 0 0  Trouble concentrating 0 0 0  Moving slowly or fidgety/restless 0 0 0  Suicidal thoughts 0 0 0  PHQ-9 Score 1 1 0     Review of Systems:   Pertinent items are noted in HPI Denies fever/chills, dizziness, headaches, visual disturbances, fatigue, shortness of breath, chest pain, abdominal pain, vomiting, bowel movements, urination, or intercourse unless otherwise stated above.  Pertinent History Reviewed:  Reviewed past medical,surgical, social, obstetrical and family history.  Reviewed problem list, medications and allergies. Physical Assessment:   Vitals:   02/18/21 1215  BP: 114/66  Pulse: (!) 110  Weight: (!) 340 lb 3.2 oz (154.3 kg)  Height: 5\' 2"  (1.575 m)  Body mass index is 62.22 kg/m.       Physical Examination:   General appearance: alert, well appearing, and in no distress  Psych: mood appropriate, normal affect  Skin: warm & dry   Cardiovascular: normal heart rate noted  Respiratory: normal respiratory effort, no distress  Abdomen: soft, non-tender   Pelvic: normal external genitalia, vulva, vagina, cervix, uterus and adnexa,  VULVA: normal appearing vulva with no masses, tenderness or lesions, perineal body with exposed granulated tissue noted- silver nitrazine applied.  No fistula appreciated, no erythema or exudate  Extremities: 1+ edema  Chaperone:    Assessment & Plan:  1) 3b laceration -irregular granulation tissue noted, reviewed conservative management -f/u in 1-2wks for final postpartum visit and re-evaluation   Faith Rogue, DO Attending Obstetrician & Gynecologist, [name], [facility]

## 2021-02-21 ENCOUNTER — Ambulatory Visit: Payer: BC Managed Care – PPO | Admitting: Obstetrics & Gynecology

## 2021-03-06 ENCOUNTER — Ambulatory Visit: Payer: BC Managed Care – PPO | Admitting: Women's Health

## 2021-03-19 DIAGNOSIS — Z79899 Other long term (current) drug therapy: Secondary | ICD-10-CM | POA: Diagnosis not present

## 2021-03-19 DIAGNOSIS — L732 Hidradenitis suppurativa: Secondary | ICD-10-CM | POA: Diagnosis not present

## 2021-03-25 ENCOUNTER — Other Ambulatory Visit: Payer: Self-pay

## 2021-03-25 ENCOUNTER — Ambulatory Visit (INDEPENDENT_AMBULATORY_CARE_PROVIDER_SITE_OTHER): Payer: BC Managed Care – PPO | Admitting: Women's Health

## 2021-03-25 ENCOUNTER — Encounter: Payer: Self-pay | Admitting: Women's Health

## 2021-03-25 DIAGNOSIS — I10 Essential (primary) hypertension: Secondary | ICD-10-CM

## 2021-03-25 NOTE — Progress Notes (Signed)
POSTPARTUM VISIT Patient name: Mallory Atkins MRN [id number]  Date of birth: 02-Dec-1990 Chief Complaint:   Postpartum Care  History of Present Illness:   Mallory Atkins is a 30 y.o. G74P1001 Caucasian female being seen today for a postpartum visit. She is 9 weeks postpartum following a vacuum, low d/t NRFHR at 39.1 gestational weeks. IOL: yes, for chronic hypertension . Anesthesia: epidural.  Laceration: Rt sulcus & 3b repaired in OR (d/t poor visualization and pain control).  Complications: PPH [id number]. Inpatient contraception: no.   Pregnancy complicated by [facility] dx during pregnancy, no meds prior to pregnancy . Tobacco use: no. Substance use disorder: no. Last pap smear: 2020 and results were negative per pt report at [place] . Next pap smear due: 2023 Patient's last menstrual period was 03/09/2021.  Postpartum course has been uncomplicated. Bleeding none. Bowel function is normal. Bladder function is normal. Urinary incontinence? no, fecal incontinence? no Patient is not sexually active. Last sexual activity: prior to birth of baby. Desired contraception: Condoms and NFP . Patient does not know want a pregnancy in the future.  Desired family size is uncertain #of children.   Upstream - 03/25/21 1510       Pregnancy Intention Screening   Does the patient want to become pregnant in the next year? No    Does the patient's partner want to become pregnant in the next year? No    Would the patient like to discuss contraceptive options today? No      Contraception Wrap Up   Current Method Abstinence    End Method Abstinence    Contraception Counseling Provided No            The pregnancy intention screening data noted above was reviewed. Potential methods of contraception were discussed. The patient elected to proceed with Abstinence.  Edinburgh Postpartum Depression Screening: negative  Edinburgh Postnatal Depression Scale - 03/25/21 1508       Edinburgh Postnatal Depression  Scale:  In the Past 7 Days   I have been able to laugh and see the funny side of things. 0    I have looked forward with enjoyment to things. 0    I have blamed myself unnecessarily when things went wrong. 0    I have been anxious or worried for no good reason. 0    I have felt scared or panicky for no good reason. 0    Things have been getting on top of me. 0    I have been so unhappy that I have had difficulty sleeping. 0    I have felt sad or miserable. 0    I have been so unhappy that I have been crying. 0    The thought of harming myself has occurred to me. 0    Edinburgh Postnatal Depression Scale Total 0             GAD 7 : Generalized Anxiety Score 10/30/2020 08/03/2020 05/17/2020  Nervous, Anxious, on Edge 0 0 0  Control/stop worrying 0 0 0  Worry too much - different things 0 0 0  Trouble relaxing 0 0 0  Restless 0 0 0  Easily annoyed or irritable 0 0 0  Afraid - awful might happen 0 0 0  Total GAD 7 Score 0 0 0     Baby's course has been uncomplicated. Baby is feeding by bottle. Infant has a pediatrician/family doctor? Yes.  Childcare strategy if returning to work/school:  did not discuss .  Pt  has material needs met for her and baby: Yes.   Review of Systems:   Pertinent items are noted in HPI Denies Abnormal vaginal discharge w/ itching/odor/irritation, headaches, visual changes, shortness of breath, chest pain, abdominal pain, severe nausea/vomiting, or problems with urination or bowel movements. Pertinent History Reviewed:  Reviewed past medical,surgical, obstetrical and family history.  Reviewed problem list, medications and allergies. OB History  Gravida Para Term Preterm AB Living  _0 0 0 1  SAB IAB Ectopic Multiple Live Births  0 0 0 0 1    # Outcome Date GA Lbr Len/2nd Weight Sex Delivery Anes PTL Lv  1 Term 01/18/21 [id number]:45 / 00:11 7 lb 4 oz (3.289 kg) F Vag-Vacuum EPI  LIV   Physical Assessment:   Vitals:   03/25/21 1502 03/25/21 1540  BP:  (!) 138/91 (!) 137/94  Pulse: 95   Weight: (!) 343 lb 3.2 oz (155.7 kg)   Height: _1  (1.575 m)   Body mass index is 62.77 kg/m.       Physical Examination:   General appearance: alert, well appearing, and in no distress  Mental status: alert, oriented to person, place, and time  Skin: warm & dry   Cardiovascular: normal heart rate noted   Respiratory: normal respiratory effort, no distress   Breasts: deferred, no complaints   Abdomen: soft, non-tender   Pelvic: lac healing well. Thin prep pap obtained: No  Rectal: not examined  Extremities: Edema: none   Chaperone: Peggy Dones         No results found for this or any previous visit (from the past 24 hour(s)).  Assessment & Plan:  1) Postpartum exam 2) 8 wks s/p vacuum, low for NRFHR during IOL for CHTN 3) bottle feeding 4) Depression screening 5) Contraception counseling> plans condoms, NFP 6) 3b lac> repaired in OR, healing well 7) CHTN> rx norvasc 529m8) PPH  Essential components of care per ACOG recommendations:  1.  Mood and well being:  If positive depression screen, discussed and plan developed.  If using tobacco we discussed reduction/cessation and risk of relapse If current substance abuse, we discussed and referral to local resources was offered.   2. Infant care and feeding:  If breastfeeding, discussed returning to work, pumping, breastfeeding-associated pain, guidance regarding return to fertility while lactating if not using another method. If needed, patient was provided with a letter to be allowed to pump q 2-3hrs to support lactation in a private location with access to a refrigerator to store breastmilk.   Recommended that all caregivers be immunized for flu, pertussis and other preventable communicable diseases If pt does not have material needs met for her/baby, referred to local resources for help obtaining these.  3. Sexuality, contraception and birth spacing Provided guidance regarding sexuality,  management of dyspareunia, and resumption of intercourse Discussed avoiding interpregnancy interval <59m29m and recommended birth spacing of 18 months  4. Sleep and fatigue Discussed coping options for fatigue and sleep disruption Encouraged family/partner/community support of 4 hrs of uninterrupted sleep to help with mood and fatigue  5. Physical recovery  If pt had a C/S, assessed incisional pain and providing guidance on normal vs prolonged recovery If pt had a laceration, perineal healing and pain reviewed.  If urinary or fecal incontinence, discussed management and referred to PT or uro/gyn if indicated  Patient is safe to resume physical activity. Discussed attainment of healthy weight.  6.  Chronic disease management Discussed pregnancy complications if any,  and their implications for future childbearing and long-term maternal health. Review recommendations for prevention of recurrent pregnancy complications, such as 17 hydroxyprogesterone caproate to reduce risk for recurrent PTB not applicable, or aspirin to reduce risk of preeclampsia yes. Pt had GDM: no. If yes, 2hr GTT scheduled: not applicable. Reviewed medications and non-pregnant dosing including consideration of whether pt is breastfeeding using a reliable resource such as LactMed: not applicable Referred for f/u w/ PCP or subspecialist providers as indicated: not applicable  7. Health maintenance Mammogram at 30yo or earlier if indicated Pap smears as indicated  Meds: No orders of the defined types were placed in this encounter.   Follow-up: Return in about 1 week (around 04/01/2021) for nurse bp check; then 60yrpap & physical.   No orders of the defined types were placed in this encounter.   KLad[facility]mith [facility]9/19/2022 3:42 PM

## 2021-03-27 ENCOUNTER — Other Ambulatory Visit: Payer: Self-pay | Admitting: Advanced Practice Midwife

## 2021-03-27 MED ORDER — AMLODIPINE BESYLATE 5 MG PO TABS: 5.0000 mg | ORAL_TABLET | Freq: Every day | ORAL | 3 refills | Status: DC

## 2021-04-01 ENCOUNTER — Ambulatory Visit (INDEPENDENT_AMBULATORY_CARE_PROVIDER_SITE_OTHER): Payer: BC Managed Care – PPO

## 2021-04-01 ENCOUNTER — Other Ambulatory Visit: Payer: Self-pay

## 2021-04-01 VITALS — BP 133/94 | HR 80

## 2021-04-01 DIAGNOSIS — Z0130 Encounter for examination of blood pressure without abnormal findings: Secondary | ICD-10-CM

## 2021-04-01 MED ORDER — AMLODIPINE BESYLATE 10 MG PO TABS: 10.0000 mg | ORAL_TABLET | Freq: Every day | ORAL | 11 refills | Status: DC

## 2021-04-01 NOTE — Addendum Note (Signed)
Addended by: Cheral Marker on: 04/01/2021 04:53 PM   Modules accepted: Orders

## 2021-04-01 NOTE — Progress Notes (Addendum)
   NURSE VISIT- BLOOD PRESSURE CHECK  SUBJECTIVE:  Mallory Atkins is a 30 y.o. G45P1001 female here for BP check. She is postpartum, delivery date 01/18/21     HYPERTENSION ROS:  Postpartum:  Severe headaches that don't go away with tylenol/other medicines: No  Visual changes (seeing spots/double/blurred vision) No  Severe pain under right breast breast or in center of upper chest No  Severe nausea/vomiting No  Taking medicines as instructed yes  OBJECTIVE:  LMP 03/09/2021   Appearance alert, well appearing, and in no distress and oriented to person, place, and time.  ASSESSMENT: Postpartum  blood pressure check  PLAN: Discussed with Mallory Atkins, CNM, [facility]   Recommendations: new prescription will be sent   Follow-up:  1 week for BP check    Mallory Atkins A Yasheka Fossett  04/01/2021 10:54 AM   Chart reviewed for nurse visit. Agree with plan of care. Rx norvasc 10mg  04/01/2021 4:52 PM

## 2021-04-12 DIAGNOSIS — M654 Radial styloid tenosynovitis [de Quervain]: Secondary | ICD-10-CM | POA: Diagnosis not present

## 2021-04-12 DIAGNOSIS — M79641 Pain in right hand: Secondary | ICD-10-CM | POA: Diagnosis not present

## 2021-04-12 DIAGNOSIS — M79642 Pain in left hand: Secondary | ICD-10-CM | POA: Diagnosis not present

## 2021-04-19 ENCOUNTER — Other Ambulatory Visit: Payer: Self-pay

## 2021-04-24 DIAGNOSIS — I10 Essential (primary) hypertension: Secondary | ICD-10-CM | POA: Diagnosis not present

## 2021-04-24 DIAGNOSIS — E669 Obesity, unspecified: Secondary | ICD-10-CM | POA: Diagnosis not present

## 2021-04-29 DIAGNOSIS — Z79899 Other long term (current) drug therapy: Secondary | ICD-10-CM | POA: Diagnosis not present

## 2021-04-29 DIAGNOSIS — L732 Hidradenitis suppurativa: Secondary | ICD-10-CM | POA: Diagnosis not present

## 2021-05-27 DIAGNOSIS — R739 Hyperglycemia, unspecified: Secondary | ICD-10-CM | POA: Diagnosis not present

## 2021-05-27 DIAGNOSIS — K219 Gastro-esophageal reflux disease without esophagitis: Secondary | ICD-10-CM | POA: Diagnosis not present

## 2021-05-27 DIAGNOSIS — I10 Essential (primary) hypertension: Secondary | ICD-10-CM | POA: Diagnosis not present

## 2021-05-27 DIAGNOSIS — G43909 Migraine, unspecified, not intractable, without status migrainosus: Secondary | ICD-10-CM | POA: Diagnosis not present

## 2021-05-27 DIAGNOSIS — D649 Anemia, unspecified: Secondary | ICD-10-CM | POA: Diagnosis not present

## 2021-05-27 DIAGNOSIS — Z79899 Other long term (current) drug therapy: Secondary | ICD-10-CM | POA: Diagnosis not present

## 2021-06-12 ENCOUNTER — Telehealth: Payer: Self-pay | Admitting: Pharmacy Technician

## 2021-06-12 NOTE — Telephone Encounter (Signed)
Auth Submission: APPROVED Payer:  Medication & CPT/J Code(s) submitted: [employer] (infliximab-axxq) [phone] Route of submission (phone, fax, portal):  Auth type: Buy/Bill Units/visits requested:1400 MG Reference number: [id number] Approval from: 07/29/21 to 01/20/22

## 2021-06-18 DIAGNOSIS — E538 Deficiency of other specified B group vitamins: Secondary | ICD-10-CM | POA: Diagnosis not present

## 2021-06-18 DIAGNOSIS — I10 Essential (primary) hypertension: Secondary | ICD-10-CM | POA: Diagnosis not present

## 2021-06-18 DIAGNOSIS — Z7182 Exercise counseling: Secondary | ICD-10-CM | POA: Diagnosis not present

## 2021-06-18 DIAGNOSIS — Z713 Dietary counseling and surveillance: Secondary | ICD-10-CM | POA: Diagnosis not present

## 2021-06-18 DIAGNOSIS — Z5181 Encounter for therapeutic drug level monitoring: Secondary | ICD-10-CM | POA: Diagnosis not present

## 2021-06-18 DIAGNOSIS — Z23 Encounter for immunization: Secondary | ICD-10-CM | POA: Diagnosis not present

## 2021-06-19 DIAGNOSIS — Z79899 Other long term (current) drug therapy: Secondary | ICD-10-CM | POA: Diagnosis not present

## 2021-06-19 DIAGNOSIS — Z5181 Encounter for therapeutic drug level monitoring: Secondary | ICD-10-CM | POA: Diagnosis not present

## 2021-06-24 DIAGNOSIS — L732 Hidradenitis suppurativa: Secondary | ICD-10-CM | POA: Diagnosis not present

## 2021-06-24 DIAGNOSIS — Z79899 Other long term (current) drug therapy: Secondary | ICD-10-CM | POA: Diagnosis not present

## 2021-07-02 DIAGNOSIS — M654 Radial styloid tenosynovitis [de Quervain]: Secondary | ICD-10-CM | POA: Diagnosis not present

## 2021-07-02 DIAGNOSIS — M7711 Lateral epicondylitis, right elbow: Secondary | ICD-10-CM | POA: Diagnosis not present

## 2021-07-22 DIAGNOSIS — Z713 Dietary counseling and surveillance: Secondary | ICD-10-CM | POA: Diagnosis not present

## 2021-07-22 DIAGNOSIS — Z7182 Exercise counseling: Secondary | ICD-10-CM | POA: Diagnosis not present

## 2021-07-22 DIAGNOSIS — I10 Essential (primary) hypertension: Secondary | ICD-10-CM | POA: Diagnosis not present

## 2021-07-29 DIAGNOSIS — L732 Hidradenitis suppurativa: Secondary | ICD-10-CM | POA: Diagnosis not present

## 2021-07-29 DIAGNOSIS — Z79899 Other long term (current) drug therapy: Secondary | ICD-10-CM | POA: Diagnosis not present

## 2021-09-02 DIAGNOSIS — Z7182 Exercise counseling: Secondary | ICD-10-CM | POA: Diagnosis not present

## 2021-09-02 DIAGNOSIS — Z713 Dietary counseling and surveillance: Secondary | ICD-10-CM | POA: Diagnosis not present

## 2021-09-02 DIAGNOSIS — Z5181 Encounter for therapeutic drug level monitoring: Secondary | ICD-10-CM | POA: Diagnosis not present

## 2021-09-08 DIAGNOSIS — Z79899 Other long term (current) drug therapy: Secondary | ICD-10-CM | POA: Diagnosis not present

## 2021-09-08 DIAGNOSIS — Z5181 Encounter for therapeutic drug level monitoring: Secondary | ICD-10-CM | POA: Diagnosis not present

## 2021-09-09 DIAGNOSIS — L732 Hidradenitis suppurativa: Secondary | ICD-10-CM | POA: Diagnosis not present

## 2021-09-09 DIAGNOSIS — Z79899 Other long term (current) drug therapy: Secondary | ICD-10-CM | POA: Diagnosis not present

## 2021-09-30 DIAGNOSIS — Z713 Dietary counseling and surveillance: Secondary | ICD-10-CM | POA: Diagnosis not present

## 2021-09-30 DIAGNOSIS — I10 Essential (primary) hypertension: Secondary | ICD-10-CM | POA: Diagnosis not present

## 2021-09-30 DIAGNOSIS — Z7182 Exercise counseling: Secondary | ICD-10-CM | POA: Diagnosis not present

## 2021-10-16 DIAGNOSIS — R109 Unspecified abdominal pain: Secondary | ICD-10-CM | POA: Diagnosis not present

## 2021-10-16 DIAGNOSIS — R635 Abnormal weight gain: Secondary | ICD-10-CM | POA: Diagnosis not present

## 2021-10-16 DIAGNOSIS — R739 Hyperglycemia, unspecified: Secondary | ICD-10-CM | POA: Diagnosis not present

## 2021-10-16 DIAGNOSIS — Z79899 Other long term (current) drug therapy: Secondary | ICD-10-CM | POA: Diagnosis not present

## 2021-10-16 DIAGNOSIS — R5383 Other fatigue: Secondary | ICD-10-CM | POA: Diagnosis not present

## 2021-10-16 DIAGNOSIS — D649 Anemia, unspecified: Secondary | ICD-10-CM | POA: Diagnosis not present

## 2021-10-16 DIAGNOSIS — R1110 Vomiting, unspecified: Secondary | ICD-10-CM | POA: Diagnosis not present

## 2021-10-21 DIAGNOSIS — R1110 Vomiting, unspecified: Secondary | ICD-10-CM | POA: Diagnosis not present

## 2021-10-21 DIAGNOSIS — L732 Hidradenitis suppurativa: Secondary | ICD-10-CM | POA: Diagnosis not present

## 2021-10-21 DIAGNOSIS — R55 Syncope and collapse: Secondary | ICD-10-CM | POA: Diagnosis not present

## 2021-10-21 DIAGNOSIS — R42 Dizziness and giddiness: Secondary | ICD-10-CM | POA: Diagnosis not present

## 2021-10-21 DIAGNOSIS — R110 Nausea: Secondary | ICD-10-CM | POA: Diagnosis not present

## 2021-10-21 DIAGNOSIS — Z79899 Other long term (current) drug therapy: Secondary | ICD-10-CM | POA: Diagnosis not present

## 2021-10-24 DIAGNOSIS — R7401 Elevation of levels of liver transaminase levels: Secondary | ICD-10-CM | POA: Diagnosis not present

## 2021-12-03 DIAGNOSIS — Z713 Dietary counseling and surveillance: Secondary | ICD-10-CM | POA: Diagnosis not present

## 2021-12-03 DIAGNOSIS — Z5181 Encounter for therapeutic drug level monitoring: Secondary | ICD-10-CM | POA: Diagnosis not present

## 2021-12-03 DIAGNOSIS — I10 Essential (primary) hypertension: Secondary | ICD-10-CM | POA: Diagnosis not present

## 2021-12-03 DIAGNOSIS — Z7182 Exercise counseling: Secondary | ICD-10-CM | POA: Diagnosis not present

## 2021-12-05 DIAGNOSIS — Z79899 Other long term (current) drug therapy: Secondary | ICD-10-CM | POA: Diagnosis not present

## 2021-12-05 DIAGNOSIS — L732 Hidradenitis suppurativa: Secondary | ICD-10-CM | POA: Diagnosis not present

## 2021-12-06 ENCOUNTER — Ambulatory Visit
Admission: EM | Admit: 2021-12-06 | Discharge: 2021-12-06 | Disposition: A | Discharge status: Home / Self Care | Payer: BC Managed Care – PPO | Attending: Nurse Practitioner | Admitting: Nurse Practitioner

## 2021-12-06 DIAGNOSIS — R52 Pain, unspecified: Secondary | ICD-10-CM | POA: Insufficient documentation

## 2021-12-06 DIAGNOSIS — J029 Acute pharyngitis, unspecified: Secondary | ICD-10-CM | POA: Insufficient documentation

## 2021-12-06 LAB — POCT RAPID STREP A (OFFICE): Rapid Strep A Screen: NEGATIVE

## 2021-12-06 NOTE — ED Provider Notes (Signed)
[facility]    CSN: [id number] Arrival date & time: 12/06/21  1619      History   Chief Complaint Chief Complaint  Patient presents with   Sore Throat    Sore throat and body aches    HPI Mallory Atkins is a 31 y.o. female.   Patient present for 1 day of sore throat, headache, body aches, cough, congestion, and voice change, feels like she is more raspy.  She reports it feels like she is swallowing glass.  Denies chest pain, shortness of breath, sinus pain or pressure, ear pain or drainage, abdominal pain, nausea/vomiting, diarrhea, and new rash.  She has not taken anything for her symptoms.  She denies known sick contacts, she has 11 month old baby at home and is worried to get her sick.    Past Medical History:  Diagnosis Date   Hidradenitis suppurativa    treated with remicade infusions every 6 weeks   Hypertension    Pre eclampsia   Migraines     Patient Active Problem List   Diagnosis Date Noted   Type 3b perineal laceration 01/18/2021   History of postpartum hemorrhage 01/18/2021   Frequent fever blisters 12/04/2020   Chronic hypertension 12/04/2020   Obesity affecting pregnancy, antepartum 08/03/2020   Migraines 08/03/2020   Lateral epicondylitis (tennis elbow) 08/03/2020   Hidradenitis suppurativa 04/22/2013    Past Surgical History:  Procedure Laterality Date   CHOLECYSTECTOMY  2009   hidradentitis exicision     PERINEAL LACERATION REPAIR N/A 01/18/2021   Procedure: SUTURE REPAIR PERINEAL LACERATION;  Surgeon: Chancy Milroy, MD;  Location: [facility] LD ORS;  Service: Gynecology;  Laterality: N/A;    OB History     Gravida  1   Para  1   Term  1   Preterm  0   AB  0   Living  1      SAB  0   IAB  0   Ectopic  0   Multiple  0   Live Births  1            Home Medications    Prior to Admission medications   Medication Sig Start Date End Date Taking? Authorizing Provider  inFLIXimab (REMICADE IV) Inject into the vein.    Yes [provider]  acetaminophen (TYLENOL) 500 MG tablet Take by mouth.    [provider]  amLODipine (NORVASC) 10 MG tablet Take 1 tablet (10 mg total) by mouth daily. 04/01/21   Roma Schanz, CNM  lisinopril-hydrochlorothiazide (ZESTORETIC) 20-12.5 MG tablet Take 1 tablet by mouth daily. 12/03/21   [provider]  SUMAtriptan (IMITREX) 50 MG tablet Take 50 mg by mouth 2 (two) times daily as needed. 02/08/21   [provider]    Family History Family History  Problem Relation Age of Onset   Hypertension Maternal Grandmother    Skin cancer Maternal Grandfather    Hypertension Maternal Grandfather    Hypertension Mother    Ovarian cysts Mother    Fibroids Mother    Diabetes Maternal Aunt    Epilepsy Maternal Aunt    Stroke Maternal Aunt    COPD Maternal Aunt    Diabetes Maternal Aunt    Asthma Maternal Aunt     Social History Social History   Tobacco Use   Smoking status: Never   Smokeless tobacco: Never  Substance Use Topics   Alcohol use: Not Currently    Comment: 6 months ago  Drug use: Not Currently    Types: Marijuana     Allergies   Patient has no known allergies.   Review of Systems Review of Systems Per HPI  Physical Exam Triage Vital Signs ED Triage Vitals  Enc Vitals Group     BP 12/06/21 1632 136/81     Pulse Rate 12/06/21 1632 (!) 114     Resp 12/06/21 1632 20     Temp 12/06/21 1632 98 F (36.7 C)     Temp Source 12/06/21 1632 Oral     SpO2 12/06/21 1632 98 %     Weight --      Height --      Head Circumference --      Peak Flow --      Pain Score 12/06/21 1628 10     Pain Loc --      Pain Edu? --      Excl. in [place]? --    No data found.  Updated Vital Signs BP 136/81 (BP Location: Right Arm)   Pulse (!) 114   Temp 98 F (36.7 C) (Oral)   Resp 20   LMP 11/09/2021 (Approximate)   SpO2 98%   Breastfeeding No   Visual Acuity Right Eye Distance:   Left Eye Distance:   Bilateral Distance:     Right Eye Near:   Left Eye Near:    Bilateral Near:     Physical Exam Vitals and nursing note reviewed.  Constitutional:      General: She is not in acute distress.    Appearance: Normal appearance. She is not ill-appearing or toxic-appearing.  HENT:     Head: Normocephalic and atraumatic.     Right Ear: Tympanic membrane, ear canal and external ear normal. No middle ear effusion. Tympanic membrane is not erythematous.     Left Ear: Tympanic membrane, ear canal and external ear normal.  No middle ear effusion. Tympanic membrane is not erythematous.     Nose: Congestion and rhinorrhea present.     Mouth/Throat:     Mouth: Mucous membranes are moist.     Pharynx: Oropharynx is clear. Posterior oropharyngeal erythema present. No oropharyngeal exudate.     Tonsils: No tonsillar exudate. 1+ on the right. 1+ on the left.  Eyes:     General: No scleral icterus.    Extraocular Movements: Extraocular movements intact.  Cardiovascular:     Rate and Rhythm: Normal rate and regular rhythm.  Pulmonary:     Effort: Pulmonary effort is normal. No respiratory distress.     Breath sounds: Normal breath sounds. No wheezing, rhonchi or rales.  Abdominal:     General: Abdomen is flat.  Musculoskeletal:     Cervical back: Normal range of motion and neck supple.  Lymphadenopathy:     Cervical: No cervical adenopathy.  Skin:    General: Skin is warm and dry.     Coloration: Skin is not jaundiced or pale.     Findings: No erythema or rash.  Neurological:     Mental Status: She is alert and oriented to person, place, and time.     Motor: No weakness.  Psychiatric:        Behavior: Behavior is cooperative.     UC Treatments / Results  Labs (all labs ordered are listed, but only abnormal results are displayed) Labs Reviewed  CULTURE, GROUP A STREP [facility])  POCT RAPID STREP A (OFFICE)    EKG   Radiology No results found.  Procedures  Procedures (including critical care  time)  Medications Ordered in UC Medications - No data to display  Initial Impression / Assessment and Plan / UC Course  I have reviewed the triage vital signs and the nursing notes.  Pertinent labs & imaging results that were available during my care of the patient were reviewed by me and considered in my medical decision making (see chart for details).    Rapid strep throat test today is negative.  Will send for throat culture and treat if it comes back positive.  Reassured patient that symptoms and exam findings are most consistent with a viral upper respiratory infection and explained lack of efficacy of antibiotics against viruses.   Offered COVID-19 testing, however patient declines and will perform an at-home rapid COVID-19 test.  Discussed expected course and features suggestive of secondary bacterial infection.  Continue supportive care. Increase fluid intake with water or electrolyte solution like pedialyte. Encouraged acetaminophen as needed for fever/pain. Encouraged salt water gargling, chloraseptic spray and throat lozenges. Encouraged OTC guaifenesin. Encouraged saline sinus flushes and/or neti with humidified air.  Seek care if symptoms persist or worsen despite treatment.  Final Clinical Impressions(s) / UC Diagnoses   Final diagnoses:  Acute pharyngitis, unspecified etiology  Body aches     Discharge Instructions      Rapid strep throat test today is negative; we will let you know if the throat culture comes back positive and will send in antibiotics if it is positive  Your symptoms and exam findings are most consistent with a viral upper respiratory infection. These usually run their course in about 10 days.  If your symptoms last longer than 10 days without improvement, please follow up with your primary care provider.  If your symptoms, worsen, please go to the Emergency Room.    Some things that can make you feel better are: - Increased rest - Increasing fluid with  water/sugar free electrolytes - Acetaminophen and ibuprofen as needed for fever/pain.  - Salt water gargling, chloraseptic spray and throat lozenges - OTC guaifenesin (Mucinex).  - Saline sinus flushes or a neti pot.  - Humidifying the air.     ED Prescriptions   None    PDMP not reviewed this encounter.   Eulogio Bear, NP 12/06/21 1659

## 2021-12-06 NOTE — Discharge Instructions (Addendum)
Rapid strep throat test today is negative; we will let you know if the throat culture comes back positive and will send in antibiotics if it is positive  Your symptoms and exam findings are most consistent with a viral upper respiratory infection. These usually run their course in about 10 days.  If your symptoms last longer than 10 days without improvement, please follow up with your primary care provider.  If your symptoms, worsen, please go to the Emergency Room.    Some things that can make you feel better are: - Increased rest - Increasing fluid with water/sugar free electrolytes - Acetaminophen and ibuprofen as needed for fever/pain.  - Salt water gargling, chloraseptic spray and throat lozenges - OTC guaifenesin (Mucinex).  - Saline sinus flushes or a neti pot.  - Humidifying the air.

## 2021-12-06 NOTE — ED Triage Notes (Signed)
Pt states that this morning she woke up with a scratchy throat  Pt states that throughout the day her throat felt like glass when she swallowed  Pt states she is having chills and body aches  Denies Meds

## 2021-12-09 LAB — CULTURE, GROUP A STREP (THRC)

## 2022-02-05 DIAGNOSIS — L732 Hidradenitis suppurativa: Secondary | ICD-10-CM | POA: Diagnosis not present

## 2022-02-05 DIAGNOSIS — Z79899 Other long term (current) drug therapy: Secondary | ICD-10-CM | POA: Diagnosis not present

## 2022-03-17 DIAGNOSIS — Z79899 Other long term (current) drug therapy: Secondary | ICD-10-CM | POA: Diagnosis not present

## 2022-03-17 DIAGNOSIS — L732 Hidradenitis suppurativa: Secondary | ICD-10-CM | POA: Diagnosis not present

## 2022-04-14 DIAGNOSIS — R5383 Other fatigue: Secondary | ICD-10-CM | POA: Diagnosis not present

## 2022-04-14 DIAGNOSIS — I10 Essential (primary) hypertension: Secondary | ICD-10-CM | POA: Diagnosis not present

## 2022-04-14 DIAGNOSIS — G4730 Sleep apnea, unspecified: Secondary | ICD-10-CM | POA: Diagnosis not present

## 2022-04-14 DIAGNOSIS — E559 Vitamin D deficiency, unspecified: Secondary | ICD-10-CM | POA: Diagnosis not present

## 2022-04-15 DIAGNOSIS — I10 Essential (primary) hypertension: Secondary | ICD-10-CM | POA: Diagnosis not present

## 2022-04-15 DIAGNOSIS — E559 Vitamin D deficiency, unspecified: Secondary | ICD-10-CM | POA: Diagnosis not present

## 2022-04-15 DIAGNOSIS — R5383 Other fatigue: Secondary | ICD-10-CM | POA: Diagnosis not present

## 2022-04-29 DIAGNOSIS — Z79899 Other long term (current) drug therapy: Secondary | ICD-10-CM | POA: Diagnosis not present

## 2022-04-29 DIAGNOSIS — L732 Hidradenitis suppurativa: Secondary | ICD-10-CM | POA: Diagnosis not present

## 2022-05-02 DIAGNOSIS — M7711 Lateral epicondylitis, right elbow: Secondary | ICD-10-CM | POA: Diagnosis not present

## 2022-05-02 DIAGNOSIS — M654 Radial styloid tenosynovitis [de Quervain]: Secondary | ICD-10-CM | POA: Diagnosis not present

## 2022-06-05 DIAGNOSIS — I10 Essential (primary) hypertension: Secondary | ICD-10-CM | POA: Diagnosis not present

## 2022-06-05 DIAGNOSIS — Z6841 Body Mass Index (BMI) 40.0 and over, adult: Secondary | ICD-10-CM | POA: Diagnosis not present

## 2022-06-05 DIAGNOSIS — G4730 Sleep apnea, unspecified: Secondary | ICD-10-CM | POA: Diagnosis not present

## 2022-06-05 DIAGNOSIS — G4700 Insomnia, unspecified: Secondary | ICD-10-CM | POA: Diagnosis not present

## 2022-06-09 DIAGNOSIS — Z79899 Other long term (current) drug therapy: Secondary | ICD-10-CM | POA: Diagnosis not present

## 2022-06-09 DIAGNOSIS — L732 Hidradenitis suppurativa: Secondary | ICD-10-CM | POA: Diagnosis not present

## 2022-06-24 DIAGNOSIS — Z79899 Other long term (current) drug therapy: Secondary | ICD-10-CM | POA: Diagnosis not present

## 2022-06-24 DIAGNOSIS — L732 Hidradenitis suppurativa: Secondary | ICD-10-CM | POA: Diagnosis not present

## 2022-07-22 DIAGNOSIS — Z79899 Other long term (current) drug therapy: Secondary | ICD-10-CM | POA: Diagnosis not present

## 2022-07-22 DIAGNOSIS — L732 Hidradenitis suppurativa: Secondary | ICD-10-CM | POA: Diagnosis not present

## 2022-08-10 DIAGNOSIS — G4730 Sleep apnea, unspecified: Secondary | ICD-10-CM | POA: Diagnosis not present

## 2022-08-29 DIAGNOSIS — M7711 Lateral epicondylitis, right elbow: Secondary | ICD-10-CM | POA: Diagnosis not present

## 2022-09-01 DIAGNOSIS — L732 Hidradenitis suppurativa: Secondary | ICD-10-CM | POA: Diagnosis not present

## 2022-09-01 DIAGNOSIS — Z79899 Other long term (current) drug therapy: Secondary | ICD-10-CM | POA: Diagnosis not present

## 2022-10-13 DIAGNOSIS — Z79899 Other long term (current) drug therapy: Secondary | ICD-10-CM | POA: Diagnosis not present

## 2022-10-13 DIAGNOSIS — L732 Hidradenitis suppurativa: Secondary | ICD-10-CM | POA: Diagnosis not present

## 2022-10-20 DIAGNOSIS — E559 Vitamin D deficiency, unspecified: Secondary | ICD-10-CM | POA: Diagnosis not present

## 2022-10-20 DIAGNOSIS — I10 Essential (primary) hypertension: Secondary | ICD-10-CM | POA: Diagnosis not present

## 2022-10-20 DIAGNOSIS — E663 Overweight: Secondary | ICD-10-CM | POA: Diagnosis not present

## 2022-10-20 DIAGNOSIS — G4733 Obstructive sleep apnea (adult) (pediatric): Secondary | ICD-10-CM | POA: Diagnosis not present

## 2022-10-20 DIAGNOSIS — Z8669 Personal history of other diseases of the nervous system and sense organs: Secondary | ICD-10-CM | POA: Diagnosis not present

## 2022-10-28 DIAGNOSIS — J029 Acute pharyngitis, unspecified: Secondary | ICD-10-CM | POA: Diagnosis not present

## 2022-10-28 DIAGNOSIS — Z20828 Contact with and (suspected) exposure to other viral communicable diseases: Secondary | ICD-10-CM | POA: Diagnosis not present

## 2022-10-28 DIAGNOSIS — R0981 Nasal congestion: Secondary | ICD-10-CM | POA: Diagnosis not present

## 2022-10-28 DIAGNOSIS — Z20822 Contact with and (suspected) exposure to covid-19: Secondary | ICD-10-CM | POA: Diagnosis not present

## 2022-11-18 DIAGNOSIS — L732 Hidradenitis suppurativa: Secondary | ICD-10-CM | POA: Diagnosis not present

## 2022-11-18 DIAGNOSIS — Z79899 Other long term (current) drug therapy: Secondary | ICD-10-CM | POA: Diagnosis not present

## 2022-11-24 DIAGNOSIS — L732 Hidradenitis suppurativa: Secondary | ICD-10-CM | POA: Diagnosis not present

## 2022-11-24 DIAGNOSIS — Z79899 Other long term (current) drug therapy: Secondary | ICD-10-CM | POA: Diagnosis not present

## 2022-11-30 ENCOUNTER — Ambulatory Visit
Admission: EM | Admit: 2022-11-30 | Discharge: 2022-11-30 | Disposition: A | Discharge status: Home / Self Care | Payer: BC Managed Care – PPO | Attending: Nurse Practitioner | Admitting: Nurse Practitioner

## 2022-11-30 DIAGNOSIS — J329 Chronic sinusitis, unspecified: Secondary | ICD-10-CM

## 2022-11-30 DIAGNOSIS — B9789 Other viral agents as the cause of diseases classified elsewhere: Secondary | ICD-10-CM

## 2022-11-30 LAB — POCT RAPID STREP A (OFFICE): Rapid Strep A Screen: NEGATIVE

## 2022-11-30 MED ORDER — PSEUDOEPH-BROMPHEN-DM 30-2-10 MG/5ML PO SYRP: 5.0000 mL | ORAL_SOLUTION | Freq: Four times a day (QID) | ORAL | 0 refills | Status: DC | PRN

## 2022-11-30 MED ORDER — DOXYCYCLINE HYCLATE 100 MG PO TABS: 100.0000 mg | ORAL_TABLET | Freq: Two times a day (BID) | ORAL | 0 refills | Status: AC

## 2022-11-30 NOTE — Discharge Instructions (Addendum)
The rapid strep test was negative, a throat culture is pending.  You will be contacted if the pending test result is positive. Take medication as directed. Continue your current allergy medication regimen. Increase fluids and get plenty of rest. May take over-the-counter ibuprofen or Tylenol as needed for pain, fever, or general discomfort. Continue to the normal saline rinses you are performing. For your cough, it may be helpful to use a humidifier at bedtime during sleep. As discussed, if symptoms do not improve over the next 5 days, a prescription for doxycycline 100 mg has been sent to your preferred pharmacy to pick up and start on 12/05/2022. If symptoms do not improve with this treatment, please follow-up in this clinic or with your primary care physician for further evaluation. Follow-up as needed.

## 2022-11-30 NOTE — ED Triage Notes (Signed)
Pt c/o congestion and sore throat x 5 days

## 2022-11-30 NOTE — ED Provider Notes (Signed)
[facility]    CSN: [id number] Arrival date & time: 11/30/22  1153      History   Chief Complaint No chief complaint on file.   HPI Mallory Atkins is a 32 y.o. female.   The history is provided by the patient.   Patient presents with a 5-day history of nasal congestion, postnasal drainage, headache, sinus pressure, sore throat and cough.  Patient denies fever, chills, ear pain, ear drainage, wheezing, shortness of breath, difficulty breathing, abdominal pain, nausea, vomiting, or diarrhea.  Patient reports history of seasonal allergies.  States she has been taking Mucinex, and using Flonase, performing nasal rinses, and taking her daily allergy medication.  Patient denies any obvious known sick contacts.  Past Medical History:  Diagnosis Date   Hidradenitis suppurativa    treated with remicade infusions every 6 weeks   Hypertension    Pre eclampsia   Migraines     Patient Active Problem List   Diagnosis Date Noted   Type 3b perineal laceration 01/18/2021   History of postpartum hemorrhage 01/18/2021   Frequent fever blisters 12/04/2020   Chronic hypertension 12/04/2020   Obesity affecting pregnancy, antepartum 08/03/2020   Migraines 08/03/2020   Lateral epicondylitis (tennis elbow) 08/03/2020   Hidradenitis suppurativa 04/22/2013    Past Surgical History:  Procedure Laterality Date   CHOLECYSTECTOMY  2009   hidradentitis exicision     PERINEAL LACERATION REPAIR N/A 01/18/2021   Procedure: SUTURE REPAIR PERINEAL LACERATION;  Surgeon: Hermina Staggers, MD;  Location: [facility];  Service: Gynecology;  Laterality: N/A;    OB History     Gravida  1   Para  1   Term  1   Preterm  0   AB  0   Living  1      SAB  0   IAB  0   Ectopic  0   Multiple  0   Live Births  1            Home Medications    Prior to Admission medications   Medication Sig Start Date End Date Taking? Authorizing Provider  brompheniramine-pseudoephedrine-DM  30-2-10 MG/5ML syrup Take 5 mLs by mouth 4 (four) times daily as needed. 11/30/22  Yes Rayan Ines-Warren, Sadie Haber, NP  doxycycline (VIBRA-TABS) 100 MG tablet Take 1 tablet (100 mg total) by mouth 2 (two) times daily for 10 days. 12/05/22 12/15/22 Yes Brandii Lakey-Warren, Sadie Haber, NP  acetaminophen (TYLENOL) 500 MG tablet Take by mouth.    [provider]  amLODipine (NORVASC) 10 MG tablet Take 1 tablet (10 mg total) by mouth daily. 04/01/21   Cheral Marker, CNM  inFLIXimab (REMICADE IV) Inject into the vein.    [provider]  lisinopril-hydrochlorothiazide (ZESTORETIC) 20-12.5 MG tablet Take 1 tablet by mouth daily. 12/03/21   [provider]  SUMAtriptan (IMITREX) 50 MG tablet Take 50 mg by mouth 2 (two) times daily as needed. 02/08/21   [provider]    Family History Family History  Problem Relation Age of Onset   Hypertension Maternal Grandmother    Skin cancer Maternal Grandfather    Hypertension Maternal Grandfather    Hypertension Mother    Ovarian cysts Mother    Fibroids Mother    Diabetes Maternal Aunt    Epilepsy Maternal Aunt    Stroke Maternal Aunt    COPD Maternal Aunt    Diabetes Maternal Aunt    Asthma Maternal Aunt     Social History Social  History   Tobacco Use   Smoking status: Never   Smokeless tobacco: Never  Substance Use Topics   Alcohol use: Not Currently    Comment: 6 months ago   Drug use: Not Currently    Types: Marijuana     Allergies   Patient has no known allergies.   Review of Systems Review of Systems Per HPI  Physical Exam Triage Vital Signs ED Triage Vitals  Enc Vitals Group     BP 11/30/22 1214 101/75     Pulse Rate 11/30/22 1214 (!) 109     Resp 11/30/22 1214 15     Temp 11/30/22 1214 98.8 F (37.1 C)     Temp Source 11/30/22 1214 Oral     SpO2 11/30/22 1214 96 %     Weight --      Height --      Head Circumference --      Peak Flow --      Pain Score 11/30/22 1220 8     Pain Loc --       Pain Edu? --      Excl. in GC? --    No data found.  Updated Vital Signs BP 101/75 (BP Location: Right Arm)   Pulse (!) 109   Temp 98.8 F (37.1 C) (Oral)   Resp 15   LMP 11/24/2022 (Within Days)   SpO2 96%   Breastfeeding No   Visual Acuity Right Eye Distance:   Left Eye Distance:   Bilateral Distance:    Right Eye Near:   Left Eye Near:    Bilateral Near:     Physical Exam Vitals and nursing note reviewed.  Constitutional:      General: She is not in acute distress.    Appearance: Normal appearance.  HENT:     Head: Normocephalic.     Right Ear: Tympanic membrane, ear canal and external ear normal.     Left Ear: Tympanic membrane, ear canal and external ear normal.     Nose: Congestion present. No rhinorrhea.     Right Turbinates: Enlarged and swollen.     Left Turbinates: Enlarged and swollen.     Right Sinus: Maxillary sinus tenderness present. No frontal sinus tenderness.     Left Sinus: Maxillary sinus tenderness present. No frontal sinus tenderness.     Mouth/Throat:     Mouth: Mucous membranes are moist.     Pharynx: Posterior oropharyngeal erythema present.  Eyes:     Extraocular Movements: Extraocular movements intact.     Conjunctiva/sclera: Conjunctivae normal.     Pupils: Pupils are equal, round, and reactive to light.  Cardiovascular:     Rate and Rhythm: Normal rate and regular rhythm.     Pulses: Normal pulses.     Heart sounds: Normal heart sounds.  Pulmonary:     Effort: Pulmonary effort is normal. No respiratory distress.     Breath sounds: Normal breath sounds. No stridor. No wheezing, rhonchi or rales.  Chest:     Chest wall: No tenderness.  Abdominal:     General: Bowel sounds are normal.     Palpations: Abdomen is soft.     Tenderness: There is no abdominal tenderness.  Musculoskeletal:     Cervical back: Normal range of motion.  Lymphadenopathy:     Cervical: No cervical adenopathy.  Skin:    General: Skin is warm and dry.   Neurological:     General: No focal deficit present.  Mental Status: She is alert and oriented to person, place, and time.  Psychiatric:        Mood and Affect: Mood normal.        Behavior: Behavior normal.      UC Treatments / Results  Labs (all labs ordered are listed, but only abnormal results are displayed) Labs Reviewed  POCT RAPID STREP A (OFFICE)    EKG   Radiology No results found.  Procedures Procedures (including critical care time)  Medications Ordered in UC Medications - No data to display  Initial Impression / Assessment and Plan / UC Course  I have reviewed the triage vital signs and the nursing notes.  Pertinent labs & imaging results that were available during my care of the patient were reviewed by me and considered in my medical decision making (see chart for details).  The patient is well-appearing, she is in no acute distress, although she is mildly tachycardic, vital signs are stable.  The rapid strep test is negative, a throat culture is pending.  Suspect a viral sinusitis at this time.  Will have patient continue her current allergy regimen.  Patient will be prescribed Bromfed-DM for her cough and to act as an antihistamine.  Patient was advised that if symptoms have not improved within 5 to 6 days, a prescription for doxycycline 100 mg has been sent to her preferred pharmacy to pick up.  Supportive care recommendations were provided and discussed with the patient to include over-the-counter analgesics for pain or discomfort, increasing fluids, allowing for plenty of rest, and use of a humidifier in her bedroom at nighttime during sleep.  Patient advised that if symptoms do not improve after completing entire treatment recommendations, to follow-up in this clinic or with her primary care physician for further evaluation.  Patient is in agreement with this plan of care and verbalizes understanding.  All questions were answered.  Patient stable for  discharge.  Final Clinical Impressions(s) / UC Diagnoses   Final diagnoses:  Viral sinusitis     Discharge Instructions      The rapid strep test was negative, a throat culture is pending.  You will be contacted if the pending test result is positive. Take medication as directed. Continue your current allergy medication regimen. Increase fluids and get plenty of rest. May take over-the-counter ibuprofen or Tylenol as needed for pain, fever, or general discomfort. Continue to the normal saline rinses you are performing. For your cough, it may be helpful to use a humidifier at bedtime during sleep. As discussed, if symptoms do not improve over the next 5 days, a prescription for doxycycline 100 mg has been sent to your preferred pharmacy to pick up and start on 12/05/2022. If symptoms do not improve with this treatment, please follow-up in this clinic or with your primary care physician for further evaluation. Follow-up as needed.     ED Prescriptions     Medication Sig Dispense Auth. Provider   brompheniramine-pseudoephedrine-DM 30-2-10 MG/5ML syrup Take 5 mLs by mouth 4 (four) times daily as needed. 140 mL Melena Hayes-Warren, Sadie Haber, NP   doxycycline (VIBRA-TABS) 100 MG tablet Take 1 tablet (100 mg total) by mouth 2 (two) times daily for 10 days. 20 tablet Glenna Brunkow-Warren, Sadie Haber, NP      PDMP not reviewed this encounter.   Abran Cantor, NP 11/30/22 1259

## 2022-12-09 ENCOUNTER — Ambulatory Visit: Payer: BC Managed Care – PPO | Admitting: Obstetrics and Gynecology

## 2022-12-29 DIAGNOSIS — Z6841 Body Mass Index (BMI) 40.0 and over, adult: Secondary | ICD-10-CM | POA: Diagnosis not present

## 2022-12-29 DIAGNOSIS — Z713 Dietary counseling and surveillance: Secondary | ICD-10-CM | POA: Diagnosis not present

## 2023-01-09 DIAGNOSIS — L732 Hidradenitis suppurativa: Secondary | ICD-10-CM | POA: Diagnosis not present

## 2023-01-09 DIAGNOSIS — Z79899 Other long term (current) drug therapy: Secondary | ICD-10-CM | POA: Diagnosis not present

## 2023-01-30 ENCOUNTER — Encounter: Payer: Self-pay | Admitting: Obstetrics & Gynecology

## 2023-01-30 ENCOUNTER — Other Ambulatory Visit (HOSPITAL_COMMUNITY)
Admission: RE | Admit: 2023-01-30 | Discharge: 2023-01-30 | Disposition: A | Discharge status: Home / Self Care | Payer: BC Managed Care – PPO | Source: Ambulatory Visit | Attending: Obstetrics & Gynecology | Admitting: Obstetrics & Gynecology

## 2023-01-30 ENCOUNTER — Ambulatory Visit (INDEPENDENT_AMBULATORY_CARE_PROVIDER_SITE_OTHER): Payer: BC Managed Care – PPO | Admitting: Obstetrics & Gynecology

## 2023-01-30 VITALS — BP 139/97 | HR 105 | Ht 62.0 in | Wt 323.0 lb

## 2023-01-30 DIAGNOSIS — Z6841 Body Mass Index (BMI) 40.0 and over, adult: Secondary | ICD-10-CM

## 2023-01-30 DIAGNOSIS — Z30011 Encounter for initial prescription of contraceptive pills: Secondary | ICD-10-CM

## 2023-01-30 DIAGNOSIS — Z01419 Encounter for gynecological examination (general) (routine) without abnormal findings: Secondary | ICD-10-CM

## 2023-01-30 DIAGNOSIS — F529 Unspecified sexual dysfunction not due to a substance or known physiological condition: Secondary | ICD-10-CM

## 2023-01-30 DIAGNOSIS — R030 Elevated blood-pressure reading, without diagnosis of hypertension: Secondary | ICD-10-CM | POA: Diagnosis not present

## 2023-01-30 MED ORDER — NORETHINDRONE 0.35 MG PO TABS
1.0000 | ORAL_TABLET | Freq: Every day | ORAL | 4 refills | Status: AC
Start: 2023-01-30 — End: 2023-04-30

## 2023-01-30 NOTE — Progress Notes (Signed)
WELL-WOMAN EXAMINATION Patient name: Mallory Atkins MRN [id number]  Date of birth: 01/12/1991 Chief Complaint:   Annual Exam (Periods are heavy with clots)  History of Present Illness:   Mallory Atkins is a 32 y.o. G81P1001 female being seen today for a routine well-woman exam.   Menses have always been heavy with dysmenorrhea, but since pregnancy it has been worse.  Menses every month- last for 5 days.  Soaking through super pad in 2 hours with passage of 1/2 dollar clots, on occasion larger.  Denies intermenstrual bleeding.     Upon further questioning, patient notes that she has not been sexually active since August.  Patient became tearful and discussed that she feels like they just do not have time.  Additionally she notes that the baby sleeps in their bed   Patient's last menstrual period was 01/09/2023. Denies issues with her menses The current method of family planning is abstinence.    Last pap 2021.  Last mammogram: NA. Last colonoscopy: NA     01/30/2023    9:53 AM 10/30/2020    9:23 AM 08/03/2020   11:27 AM 05/17/2020    2:50 PM  Depression screen PHQ 2/9  Decreased Interest 0 0 0 0  Down, Depressed, Hopeless 0 0 0 0  PHQ - 2 Score 0 0 0 0  Altered sleeping 1 0 0 0  Tired, decreased energy 1 1 1  0  Change in appetite 0 0 0 0  Feeling bad or failure about yourself  0 0 0 0  Trouble concentrating 1 0 0 0  Moving slowly or fidgety/restless 0 0 0 0  Suicidal thoughts 0 0 0 0  PHQ-9 Score 3 1 1  0      Review of Systems:   Pertinent items are noted in HPI Denies any headaches, blurred vision, fatigue, shortness of breath, chest pain, abdominal pain, bowel movements, urination, or intercourse unless otherwise stated above.  Pertinent History Reviewed:  Reviewed past medical,surgical, social and family history.  Reviewed problem list, medications and allergies. Physical Assessment:   Vitals:   01/30/23 0945 01/30/23 0955  BP: (!) 152/98 (!) 139/97  Pulse: (!)  102 (!) 105  Weight: (!) 323 lb (146.5 kg)   Height: 5\' 2"  (1.575 m)   Body mass index is 59.08 kg/m.        Physical Examination:   General appearance - well appearing, and in no distress  Mental status - alert, oriented to person, place, and time  Psych:  She has a normal mood and affect  Skin - warm and dry, normal color, no suspicious lesions noted  Chest - effort normal, all lung fields clear to auscultation bilaterally  Heart - normal rate and regular rhythm  Neck:  midline trachea, no thyromegaly or nodules  Breasts - breasts appear normal, no suspicious masses, no skin or nipple changes or  axillary nodes  Abdomen - obese, soft, nontender, nondistended, no masses or organomegaly  Pelvic - VULVA: normal appearing vulva with no masses, tenderness or lesions  VAGINA: normal appearing vagina with normal color and discharge, no lesions  CERVIX: normal appearing cervix without discharge or lesions, no CMT  Thin prep pap is done with HR HPV cotesting  UTERUS: uterus is felt to be normal size, shape, consistency and nontender  ADNEXA: No adnexal masses or tenderness noted.  Bimanual exam limited due to body habitus  Extremities:  No swelling or varicosities noted  Chaperone:  Mallory Atkins  Assessment & Plan:  1) Well-Woman Exam -Pap collected, reviewed ASCCP guidelines  2) contraceptive management, HMB -Due to elevated blood pressure, reviewed progesterone only options -Discussed medications, LARCs or potential surgical intervention -Plan to start on Pops, follow-up in 3 months  3) Elevated blood pressure -Patient notes normal BP at home and prior visits with normal reads -Notes whitecoat syndrome  4) Sexual dysfunction/relationship issues -Reassured patient that this is a common occurrence after having a child -Recommended counseling/therapy and encourage patient to discuss issues with her husband  Meds ordered this encounter  Medications   norethindrone (MICRONOR)  0.35 MG tablet    Sig: Take 1 tablet (0.35 mg total) by mouth daily.    Dispense:  90 tablet    Refill:  4    Meds:  Meds ordered this encounter  Medications   norethindrone (MICRONOR) 0.35 MG tablet    Sig: Take 1 tablet (0.35 mg total) by mouth daily.    Dispense:  90 tablet    Refill:  4    Follow-up: Return in about 3 months (around 05/02/2023) for Medication follow up.   [name], DO Attending Obstetrician & Gynecologist, [facility] for Mallory Atkins, [facility]

## 2023-02-20 DIAGNOSIS — Z79899 Other long term (current) drug therapy: Secondary | ICD-10-CM | POA: Diagnosis not present

## 2023-02-20 DIAGNOSIS — L732 Hidradenitis suppurativa: Secondary | ICD-10-CM | POA: Diagnosis not present

## 2023-02-26 DIAGNOSIS — G4700 Insomnia, unspecified: Secondary | ICD-10-CM | POA: Diagnosis not present

## 2023-02-26 DIAGNOSIS — G43709 Chronic migraine without aura, not intractable, without status migrainosus: Secondary | ICD-10-CM | POA: Diagnosis not present

## 2023-04-07 DIAGNOSIS — G8918 Other acute postprocedural pain: Secondary | ICD-10-CM | POA: Diagnosis not present

## 2023-04-07 DIAGNOSIS — L732 Hidradenitis suppurativa: Secondary | ICD-10-CM | POA: Diagnosis not present

## 2023-04-08 DIAGNOSIS — Z79899 Other long term (current) drug therapy: Secondary | ICD-10-CM | POA: Diagnosis not present

## 2023-04-08 DIAGNOSIS — L732 Hidradenitis suppurativa: Secondary | ICD-10-CM | POA: Diagnosis not present

## 2023-04-09 DIAGNOSIS — Z79899 Other long term (current) drug therapy: Secondary | ICD-10-CM | POA: Diagnosis not present

## 2023-04-09 DIAGNOSIS — G4700 Insomnia, unspecified: Secondary | ICD-10-CM | POA: Diagnosis not present

## 2023-04-09 DIAGNOSIS — G43709 Chronic migraine without aura, not intractable, without status migrainosus: Secondary | ICD-10-CM | POA: Diagnosis not present

## 2023-04-09 DIAGNOSIS — E669 Obesity, unspecified: Secondary | ICD-10-CM | POA: Diagnosis not present

## 2023-05-01 DIAGNOSIS — M7711 Lateral epicondylitis, right elbow: Secondary | ICD-10-CM | POA: Diagnosis not present

## 2023-05-15 DIAGNOSIS — Z79899 Other long term (current) drug therapy: Secondary | ICD-10-CM | POA: Diagnosis not present

## 2023-05-15 DIAGNOSIS — L732 Hidradenitis suppurativa: Secondary | ICD-10-CM | POA: Diagnosis not present

## 2023-05-19 DIAGNOSIS — Z6841 Body Mass Index (BMI) 40.0 and over, adult: Secondary | ICD-10-CM | POA: Diagnosis not present

## 2023-05-19 DIAGNOSIS — Z713 Dietary counseling and surveillance: Secondary | ICD-10-CM | POA: Diagnosis not present

## 2023-05-21 DIAGNOSIS — Z6841 Body Mass Index (BMI) 40.0 and over, adult: Secondary | ICD-10-CM | POA: Diagnosis not present

## 2023-05-21 DIAGNOSIS — Z713 Dietary counseling and surveillance: Secondary | ICD-10-CM | POA: Diagnosis not present

## 2023-05-21 DIAGNOSIS — E88818 Other insulin resistance: Secondary | ICD-10-CM | POA: Diagnosis not present

## 2023-06-26 DIAGNOSIS — L732 Hidradenitis suppurativa: Secondary | ICD-10-CM | POA: Diagnosis not present

## 2023-06-26 DIAGNOSIS — Z79899 Other long term (current) drug therapy: Secondary | ICD-10-CM | POA: Diagnosis not present

## 2023-07-16 DIAGNOSIS — E669 Obesity, unspecified: Secondary | ICD-10-CM | POA: Diagnosis not present

## 2023-07-16 DIAGNOSIS — G43709 Chronic migraine without aura, not intractable, without status migrainosus: Secondary | ICD-10-CM | POA: Diagnosis not present

## 2023-07-16 DIAGNOSIS — G4700 Insomnia, unspecified: Secondary | ICD-10-CM | POA: Diagnosis not present

## 2023-07-16 DIAGNOSIS — Z79899 Other long term (current) drug therapy: Secondary | ICD-10-CM | POA: Diagnosis not present

## 2023-08-07 DIAGNOSIS — Z79899 Other long term (current) drug therapy: Secondary | ICD-10-CM | POA: Diagnosis not present

## 2023-08-07 DIAGNOSIS — L732 Hidradenitis suppurativa: Secondary | ICD-10-CM | POA: Diagnosis not present

## 2023-08-20 DIAGNOSIS — K219 Gastro-esophageal reflux disease without esophagitis: Secondary | ICD-10-CM | POA: Diagnosis not present

## 2023-08-20 DIAGNOSIS — I10 Essential (primary) hypertension: Secondary | ICD-10-CM | POA: Diagnosis not present

## 2023-08-20 DIAGNOSIS — Z131 Encounter for screening for diabetes mellitus: Secondary | ICD-10-CM | POA: Diagnosis not present

## 2023-08-20 DIAGNOSIS — Z79899 Other long term (current) drug therapy: Secondary | ICD-10-CM | POA: Diagnosis not present

## 2023-08-20 DIAGNOSIS — Z13220 Encounter for screening for lipoid disorders: Secondary | ICD-10-CM | POA: Diagnosis not present

## 2023-08-20 DIAGNOSIS — G43909 Migraine, unspecified, not intractable, without status migrainosus: Secondary | ICD-10-CM | POA: Diagnosis not present

## 2023-08-20 DIAGNOSIS — L732 Hidradenitis suppurativa: Secondary | ICD-10-CM | POA: Diagnosis not present

## 2023-08-20 DIAGNOSIS — E559 Vitamin D deficiency, unspecified: Secondary | ICD-10-CM | POA: Diagnosis not present

## 2023-09-18 DIAGNOSIS — Z79899 Other long term (current) drug therapy: Secondary | ICD-10-CM | POA: Diagnosis not present

## 2023-09-18 DIAGNOSIS — L732 Hidradenitis suppurativa: Secondary | ICD-10-CM | POA: Diagnosis not present

## 2023-09-24 DIAGNOSIS — G43909 Migraine, unspecified, not intractable, without status migrainosus: Secondary | ICD-10-CM | POA: Diagnosis not present

## 2023-09-24 DIAGNOSIS — L732 Hidradenitis suppurativa: Secondary | ICD-10-CM | POA: Diagnosis not present

## 2023-09-24 DIAGNOSIS — E538 Deficiency of other specified B group vitamins: Secondary | ICD-10-CM | POA: Diagnosis not present

## 2023-09-24 DIAGNOSIS — I10 Essential (primary) hypertension: Secondary | ICD-10-CM | POA: Diagnosis not present

## 2023-09-25 DIAGNOSIS — Z713 Dietary counseling and surveillance: Secondary | ICD-10-CM | POA: Diagnosis not present

## 2023-09-25 DIAGNOSIS — Z0130 Encounter for examination of blood pressure without abnormal findings: Secondary | ICD-10-CM | POA: Diagnosis not present

## 2023-09-25 DIAGNOSIS — E88818 Other insulin resistance: Secondary | ICD-10-CM | POA: Diagnosis not present

## 2023-09-25 DIAGNOSIS — Z6841 Body Mass Index (BMI) 40.0 and over, adult: Secondary | ICD-10-CM | POA: Diagnosis not present

## 2023-10-19 DIAGNOSIS — M7711 Lateral epicondylitis, right elbow: Secondary | ICD-10-CM | POA: Diagnosis not present

## 2023-10-30 DIAGNOSIS — Z79899 Other long term (current) drug therapy: Secondary | ICD-10-CM | POA: Diagnosis not present

## 2023-10-30 DIAGNOSIS — L732 Hidradenitis suppurativa: Secondary | ICD-10-CM | POA: Diagnosis not present

## 2023-12-11 DIAGNOSIS — Z79899 Other long term (current) drug therapy: Secondary | ICD-10-CM | POA: Diagnosis not present

## 2023-12-11 DIAGNOSIS — L732 Hidradenitis suppurativa: Secondary | ICD-10-CM | POA: Diagnosis not present

## 2023-12-22 DIAGNOSIS — Z79899 Other long term (current) drug therapy: Secondary | ICD-10-CM | POA: Diagnosis not present

## 2023-12-22 DIAGNOSIS — Z131 Encounter for screening for diabetes mellitus: Secondary | ICD-10-CM | POA: Diagnosis not present

## 2023-12-22 DIAGNOSIS — Z713 Dietary counseling and surveillance: Secondary | ICD-10-CM | POA: Diagnosis not present

## 2023-12-22 DIAGNOSIS — K219 Gastro-esophageal reflux disease without esophagitis: Secondary | ICD-10-CM | POA: Diagnosis not present

## 2023-12-22 DIAGNOSIS — I10 Essential (primary) hypertension: Secondary | ICD-10-CM | POA: Diagnosis not present

## 2023-12-22 DIAGNOSIS — F900 Attention-deficit hyperactivity disorder, predominantly inattentive type: Secondary | ICD-10-CM | POA: Diagnosis not present

## 2024-02-05 DIAGNOSIS — Z79899 Other long term (current) drug therapy: Secondary | ICD-10-CM | POA: Diagnosis not present

## 2024-02-05 DIAGNOSIS — L732 Hidradenitis suppurativa: Secondary | ICD-10-CM | POA: Diagnosis not present

## 2024-03-02 DIAGNOSIS — R319 Hematuria, unspecified: Secondary | ICD-10-CM | POA: Diagnosis not present

## 2024-03-02 DIAGNOSIS — R3589 Other polyuria: Secondary | ICD-10-CM | POA: Diagnosis not present

## 2024-03-02 DIAGNOSIS — L732 Hidradenitis suppurativa: Secondary | ICD-10-CM | POA: Diagnosis not present

## 2024-03-02 DIAGNOSIS — R300 Dysuria: Secondary | ICD-10-CM | POA: Diagnosis not present

## 2024-03-02 DIAGNOSIS — Z7251 High risk heterosexual behavior: Secondary | ICD-10-CM | POA: Diagnosis not present

## 2024-03-02 DIAGNOSIS — Z87440 Personal history of urinary (tract) infections: Secondary | ICD-10-CM | POA: Diagnosis not present

## 2024-03-08 DIAGNOSIS — G4700 Insomnia, unspecified: Secondary | ICD-10-CM | POA: Diagnosis not present

## 2024-03-08 DIAGNOSIS — Z79899 Other long term (current) drug therapy: Secondary | ICD-10-CM | POA: Diagnosis not present

## 2024-03-08 DIAGNOSIS — E669 Obesity, unspecified: Secondary | ICD-10-CM | POA: Diagnosis not present

## 2024-03-08 DIAGNOSIS — G43709 Chronic migraine without aura, not intractable, without status migrainosus: Secondary | ICD-10-CM | POA: Diagnosis not present

## 2024-03-18 DIAGNOSIS — Z79899 Other long term (current) drug therapy: Secondary | ICD-10-CM | POA: Diagnosis not present

## 2024-03-18 DIAGNOSIS — L732 Hidradenitis suppurativa: Secondary | ICD-10-CM | POA: Diagnosis not present

## 2024-03-31 DIAGNOSIS — Z87440 Personal history of urinary (tract) infections: Secondary | ICD-10-CM | POA: Diagnosis not present

## 2024-03-31 DIAGNOSIS — Z6841 Body Mass Index (BMI) 40.0 and over, adult: Secondary | ICD-10-CM | POA: Diagnosis not present

## 2024-03-31 DIAGNOSIS — M2550 Pain in unspecified joint: Secondary | ICD-10-CM | POA: Diagnosis not present

## 2024-03-31 DIAGNOSIS — Z800 Family history of malignant neoplasm of digestive organs: Secondary | ICD-10-CM | POA: Diagnosis not present

## 2024-03-31 DIAGNOSIS — R3589 Other polyuria: Secondary | ICD-10-CM | POA: Diagnosis not present

## 2024-03-31 DIAGNOSIS — D6489 Other specified anemias: Secondary | ICD-10-CM | POA: Diagnosis not present

## 2024-03-31 DIAGNOSIS — L732 Hidradenitis suppurativa: Secondary | ICD-10-CM | POA: Diagnosis not present

## 2024-03-31 DIAGNOSIS — F988 Other specified behavioral and emotional disorders with onset usually occurring in childhood and adolescence: Secondary | ICD-10-CM | POA: Diagnosis not present

## 2024-03-31 DIAGNOSIS — R300 Dysuria: Secondary | ICD-10-CM | POA: Diagnosis not present

## 2024-03-31 DIAGNOSIS — F411 Generalized anxiety disorder: Secondary | ICD-10-CM | POA: Diagnosis not present

## 2024-04-11 DIAGNOSIS — Z79899 Other long term (current) drug therapy: Secondary | ICD-10-CM | POA: Diagnosis not present

## 2024-04-11 DIAGNOSIS — L732 Hidradenitis suppurativa: Secondary | ICD-10-CM | POA: Diagnosis not present

## 2024-04-11 DIAGNOSIS — G8918 Other acute postprocedural pain: Secondary | ICD-10-CM | POA: Diagnosis not present

## 2024-04-14 DIAGNOSIS — Z800 Family history of malignant neoplasm of digestive organs: Secondary | ICD-10-CM | POA: Diagnosis not present

## 2024-04-14 DIAGNOSIS — Z83719 Family history of colon polyps, unspecified: Secondary | ICD-10-CM | POA: Diagnosis not present

## 2024-04-14 DIAGNOSIS — K219 Gastro-esophageal reflux disease without esophagitis: Secondary | ICD-10-CM | POA: Diagnosis not present

## 2024-04-14 DIAGNOSIS — Z1211 Encounter for screening for malignant neoplasm of colon: Secondary | ICD-10-CM | POA: Diagnosis not present

## 2024-04-29 DIAGNOSIS — L732 Hidradenitis suppurativa: Secondary | ICD-10-CM | POA: Diagnosis not present

## 2024-04-29 DIAGNOSIS — Z79899 Other long term (current) drug therapy: Secondary | ICD-10-CM | POA: Diagnosis not present

## 2024-05-12 DIAGNOSIS — E119 Type 2 diabetes mellitus without complications: Secondary | ICD-10-CM | POA: Diagnosis not present

## 2024-05-12 DIAGNOSIS — F988 Other specified behavioral and emotional disorders with onset usually occurring in childhood and adolescence: Secondary | ICD-10-CM | POA: Diagnosis not present

## 2024-05-12 DIAGNOSIS — M2550 Pain in unspecified joint: Secondary | ICD-10-CM | POA: Diagnosis not present

## 2024-05-12 DIAGNOSIS — R7989 Other specified abnormal findings of blood chemistry: Secondary | ICD-10-CM | POA: Diagnosis not present

## 2024-06-01 DIAGNOSIS — F988 Other specified behavioral and emotional disorders with onset usually occurring in childhood and adolescence: Secondary | ICD-10-CM | POA: Diagnosis not present

## 2024-06-01 DIAGNOSIS — I10 Essential (primary) hypertension: Secondary | ICD-10-CM | POA: Diagnosis not present

## 2024-06-01 DIAGNOSIS — Z7182 Exercise counseling: Secondary | ICD-10-CM | POA: Diagnosis not present

## 2024-06-01 DIAGNOSIS — E119 Type 2 diabetes mellitus without complications: Secondary | ICD-10-CM | POA: Diagnosis not present

## 2024-06-01 DIAGNOSIS — Z713 Dietary counseling and surveillance: Secondary | ICD-10-CM | POA: Diagnosis not present

## 2024-06-05 DIAGNOSIS — H6690 Otitis media, unspecified, unspecified ear: Secondary | ICD-10-CM | POA: Diagnosis not present

## 2024-06-05 DIAGNOSIS — J00 Acute nasopharyngitis [common cold]: Secondary | ICD-10-CM | POA: Diagnosis not present

## 2024-06-05 DIAGNOSIS — R030 Elevated blood-pressure reading, without diagnosis of hypertension: Secondary | ICD-10-CM | POA: Diagnosis not present

## 2024-06-09 DIAGNOSIS — Z79899 Other long term (current) drug therapy: Secondary | ICD-10-CM | POA: Diagnosis not present

## 2024-06-09 DIAGNOSIS — G4700 Insomnia, unspecified: Secondary | ICD-10-CM | POA: Diagnosis not present

## 2024-06-09 DIAGNOSIS — G43709 Chronic migraine without aura, not intractable, without status migrainosus: Secondary | ICD-10-CM | POA: Diagnosis not present

## 2024-06-09 DIAGNOSIS — E669 Obesity, unspecified: Secondary | ICD-10-CM | POA: Diagnosis not present

## 2024-06-27 DIAGNOSIS — R0981 Nasal congestion: Secondary | ICD-10-CM | POA: Diagnosis not present

## 2024-06-27 DIAGNOSIS — J019 Acute sinusitis, unspecified: Secondary | ICD-10-CM | POA: Diagnosis not present

## 2024-06-27 DIAGNOSIS — R509 Fever, unspecified: Secondary | ICD-10-CM | POA: Diagnosis not present
